# Patient Record
Sex: Male | Born: 1955 | Race: White | Hispanic: No | Marital: Single | Smoking: Never smoker
Health system: Southern US, Community
[De-identification: ages and names within clinical notes are randomized; demographics above are authoritative.]

## PROBLEM LIST (undated history)

## (undated) DIAGNOSIS — E079 Disorder of thyroid, unspecified: Secondary | ICD-10-CM

## (undated) DIAGNOSIS — N400 Enlarged prostate without lower urinary tract symptoms: Secondary | ICD-10-CM

## (undated) DIAGNOSIS — I10 Essential (primary) hypertension: Secondary | ICD-10-CM

## (undated) DIAGNOSIS — K760 Fatty (change of) liver, not elsewhere classified: Secondary | ICD-10-CM

## (undated) DIAGNOSIS — I4891 Unspecified atrial fibrillation: Secondary | ICD-10-CM

## (undated) DIAGNOSIS — F1010 Alcohol abuse, uncomplicated: Secondary | ICD-10-CM

## (undated) DIAGNOSIS — J45909 Unspecified asthma, uncomplicated: Secondary | ICD-10-CM

## (undated) HISTORY — DX: Fatty (change of) liver, not elsewhere classified: K76.0

## (undated) HISTORY — PX: TONSILLECTOMY: SUR1361

## (undated) HISTORY — DX: Alcohol abuse, uncomplicated: F10.10

## (undated) HISTORY — DX: Benign prostatic hyperplasia without lower urinary tract symptoms: N40.0

## (undated) HISTORY — PX: VARICOSE VEIN SURGERY: SHX832

## (undated) HISTORY — DX: Unspecified atrial fibrillation: I48.91

## (undated) HISTORY — PX: TOTAL HIP ARTHROPLASTY: SHX124

---

## 2020-07-04 ENCOUNTER — Emergency Department (HOSPITAL_COMMUNITY): Payer: 59

## 2020-07-04 ENCOUNTER — Emergency Department (HOSPITAL_COMMUNITY)
Admission: EM | Admit: 2020-07-04 | Discharge: 2020-07-05 | Disposition: A | Discharge status: Home / Self Care | Payer: 59 | Attending: Emergency Medicine | Admitting: Emergency Medicine

## 2020-07-04 ENCOUNTER — Other Ambulatory Visit: Payer: Self-pay

## 2020-07-04 ENCOUNTER — Encounter (HOSPITAL_COMMUNITY): Payer: Self-pay

## 2020-07-04 DIAGNOSIS — Y9289 Other specified places as the place of occurrence of the external cause: Secondary | ICD-10-CM | POA: Insufficient documentation

## 2020-07-04 DIAGNOSIS — S0990XA Unspecified injury of head, initial encounter: Secondary | ICD-10-CM | POA: Diagnosis present

## 2020-07-04 DIAGNOSIS — W108XXA Fall (on) (from) other stairs and steps, initial encounter: Secondary | ICD-10-CM | POA: Diagnosis not present

## 2020-07-04 DIAGNOSIS — Y9389 Activity, other specified: Secondary | ICD-10-CM | POA: Insufficient documentation

## 2020-07-04 DIAGNOSIS — Y999 Unspecified external cause status: Secondary | ICD-10-CM | POA: Diagnosis not present

## 2020-07-04 DIAGNOSIS — Z23 Encounter for immunization: Secondary | ICD-10-CM | POA: Insufficient documentation

## 2020-07-04 DIAGNOSIS — R7401 Elevation of levels of liver transaminase levels: Secondary | ICD-10-CM

## 2020-07-04 DIAGNOSIS — S0001XA Abrasion of scalp, initial encounter: Secondary | ICD-10-CM

## 2020-07-04 DIAGNOSIS — F10930 Alcohol use, unspecified with withdrawal, uncomplicated: Secondary | ICD-10-CM

## 2020-07-04 DIAGNOSIS — R17 Unspecified jaundice: Secondary | ICD-10-CM

## 2020-07-04 DIAGNOSIS — F17210 Nicotine dependence, cigarettes, uncomplicated: Secondary | ICD-10-CM | POA: Insufficient documentation

## 2020-07-04 DIAGNOSIS — E876 Hypokalemia: Secondary | ICD-10-CM

## 2020-07-04 NOTE — ED Notes (Signed)
Jeffrey Farrell fiance [phone] would like a call when pt is in a room

## 2020-07-04 NOTE — ED Triage Notes (Signed)
Pt BIB [facility], pt fell down stairs, unsure how many steps, lac to posterior head from fall, unsure what he hit his head on, pt intoxicated, pt reports a hx of ETOH abuse.  BP 168/96 HR 74 98% RA  CBG 118 97.3  RR 20

## 2020-07-05 ENCOUNTER — Emergency Department (HOSPITAL_COMMUNITY): Payer: 59

## 2020-07-05 LAB — CBC WITH DIFFERENTIAL/PLATELET
Abs Immature Granulocytes: 0.02 10*3/uL (ref 0.00–0.07)
Basophils Absolute: 0 10*3/uL (ref 0.0–0.1)
Basophils Relative: 1 %
Eosinophils Absolute: 0 10*3/uL (ref 0.0–0.5)
Eosinophils Relative: 0 %
HCT: 45.1 % (ref 39.0–52.0)
Hemoglobin: 15.6 g/dL (ref 13.0–17.0)
Immature Granulocytes: 0 %
Lymphocytes Relative: 7 %
Lymphs Abs: 0.5 10*3/uL — ABNORMAL LOW (ref 0.7–4.0)
MCH: 32.7 pg (ref 26.0–34.0)
MCHC: 34.6 g/dL (ref 30.0–36.0)
MCV: 94.5 fL (ref 80.0–100.0)
Monocytes Absolute: 0.7 10*3/uL (ref 0.1–1.0)
Monocytes Relative: 10 %
Neutro Abs: 6.1 10*3/uL (ref 1.7–7.7)
Neutrophils Relative %: 82 %
Platelets: 127 10*3/uL — ABNORMAL LOW (ref 150–400)
RBC: 4.77 MIL/uL (ref 4.22–5.81)
RDW: 12.4 % (ref 11.5–15.5)
WBC: 7.4 10*3/uL (ref 4.0–10.5)
nRBC: 0 % (ref 0.0–0.2)

## 2020-07-05 LAB — ETHANOL: Alcohol, Ethyl (B): 86 mg/dL — ABNORMAL HIGH (ref <10)

## 2020-07-05 LAB — URINALYSIS, ROUTINE W REFLEX MICROSCOPIC
Bacteria, UA: NONE SEEN
Bilirubin Urine: NEGATIVE
Glucose, UA: NEGATIVE mg/dL
Ketones, ur: 5 mg/dL — AB
Leukocytes,Ua: NEGATIVE
Nitrite: NEGATIVE
Protein, ur: 30 mg/dL — AB
Specific Gravity, Urine: 1.018 (ref 1.005–1.030)
pH: 6 (ref 5.0–8.0)

## 2020-07-05 LAB — COMPREHENSIVE METABOLIC PANEL
ALT: 130 U/L — ABNORMAL HIGH (ref 0–44)
AST: 166 U/L — ABNORMAL HIGH (ref 15–41)
Albumin: 4.5 g/dL (ref 3.5–5.0)
Alkaline Phosphatase: 68 U/L (ref 38–126)
Anion gap: 19 — ABNORMAL HIGH (ref 5–15)
BUN: 13 mg/dL (ref 8–23)
CO2: 20 mmol/L — ABNORMAL LOW (ref 22–32)
Calcium: 9.4 mg/dL (ref 8.9–10.3)
Chloride: 89 mmol/L — ABNORMAL LOW (ref 98–111)
Creatinine, Ser: 1.1 mg/dL (ref 0.61–1.24)
GFR calc Af Amer: >60 mL/min (ref >60)
GFR calc non Af Amer: >60 mL/min (ref >60)
Glucose, Bld: 136 mg/dL — ABNORMAL HIGH (ref 70–99)
Potassium: 3.3 mmol/L — ABNORMAL LOW (ref 3.5–5.1)
Sodium: 128 mmol/L — ABNORMAL LOW (ref 135–145)
Total Bilirubin: 1.5 mg/dL — ABNORMAL HIGH (ref 0.3–1.2)
Total Protein: 8.3 g/dL — ABNORMAL HIGH (ref 6.5–8.1)

## 2020-07-05 LAB — RAPID URINE DRUG SCREEN, HOSP PERFORMED
Amphetamines: NOT DETECTED
Barbiturates: NOT DETECTED
Benzodiazepines: NOT DETECTED
Cocaine: NOT DETECTED
Opiates: NOT DETECTED
Tetrahydrocannabinol: NOT DETECTED

## 2020-07-05 LAB — MAGNESIUM: Magnesium: 1.7 mg/dL (ref 1.7–2.4)

## 2020-07-05 MED ORDER — POTASSIUM CHLORIDE CRYS ER 20 MEQ PO TBCR
40.0000 meq | EXTENDED_RELEASE_TABLET | Freq: Once | ORAL | Status: AC
  Administered 2020-07-05: 40 meq via ORAL
  Filled 2020-07-05: qty 2

## 2020-07-05 MED ORDER — TETANUS-DIPHTH-ACELL PERTUSSIS 5-2.5-18.5 LF-MCG/0.5 IM SUSP
0.5000 mL | Freq: Once | INTRAMUSCULAR | Status: AC
  Administered 2020-07-05: 0.5 mL via INTRAMUSCULAR
  Filled 2020-07-05: qty 0.5

## 2020-07-05 MED ORDER — LORAZEPAM 2 MG/ML IJ SOLN
0.0000 mg | Freq: Four times a day (QID) | INTRAMUSCULAR | Status: DC
  Administered 2020-07-05: 2 mg via INTRAVENOUS
  Filled 2020-07-05: qty 2
  Filled 2020-07-05: qty 1

## 2020-07-05 MED ORDER — LORAZEPAM 2 MG/ML IJ SOLN
2.0000 mg | Freq: Once | INTRAMUSCULAR | Status: AC
  Administered 2020-07-05: 2 mg via INTRAVENOUS

## 2020-07-05 MED ORDER — THIAMINE HCL 100 MG/ML IJ SOLN
100.0000 mg | Freq: Every day | INTRAMUSCULAR | Status: DC
  Filled 2020-07-05: qty 2

## 2020-07-05 MED ORDER — THIAMINE HCL 100 MG PO TABS: 100.0000 mg | ORAL_TABLET | Freq: Every day | ORAL | Status: DC

## 2020-07-05 MED ORDER — LORAZEPAM 1 MG PO TABS: 0.0000 mg | ORAL_TABLET | Freq: Two times a day (BID) | ORAL | Status: DC

## 2020-07-05 MED ORDER — LORAZEPAM 2 MG/ML IJ SOLN: 0.0000 mg | Freq: Two times a day (BID) | INTRAMUSCULAR | Status: DC

## 2020-07-05 MED ORDER — LORAZEPAM 2 MG/ML IJ SOLN
2.0000 mg | Freq: Once | INTRAMUSCULAR | Status: AC
  Administered 2020-07-05: 2 mg via INTRAVENOUS
  Filled 2020-07-05: qty 1

## 2020-07-05 MED ORDER — LORAZEPAM 1 MG PO TABS: 0.0000 mg | ORAL_TABLET | Freq: Four times a day (QID) | ORAL | Status: DC

## 2020-07-05 MED ORDER — MAGNESIUM SULFATE 2 GM/50ML IV SOLN
2.0000 g | Freq: Once | INTRAVENOUS | Status: AC
  Administered 2020-07-05: 2 g via INTRAVENOUS
  Filled 2020-07-05: qty 50

## 2020-07-05 MED ORDER — CHLORDIAZEPOXIDE HCL 25 MG PO CAPS
ORAL_CAPSULE | ORAL | 0 refills | Status: DC
Start: 2020-07-05 — End: 2021-11-10

## 2020-07-05 MED ORDER — LACTATED RINGERS IV BOLUS
1000.0000 mL | Freq: Once | INTRAVENOUS | Status: AC
  Administered 2020-07-05: 1000 mL via INTRAVENOUS

## 2020-07-05 NOTE — Discharge Instructions (Addendum)
Please be sure to take medication as directed, and follow-up with your physician.  Return here for concerning changes in your condition.

## 2020-07-05 NOTE — Discharge Planning (Signed)
Transition of Care [facility]) - Emergency Department Mini Assessment   Patient Details  Name: Jeffrey Farrell MRN: [id number] Date of Birth: 03-06-1956  Transition of Care [facility]) CM/SW Contact:    Oletta Cohn, RN Phone Number: 07/05/2020, 11:16 AM   Clinical Narrative:  RNCM provided taxi voucher as pt has no transportation from hospital.  ED Mini Assessment: What brought you to the Emergency Department? : Fall  Barriers to Discharge: ED Barriers Resolved  Barrier interventions: transportation provided  Means of departure: Hospital Transport  Interventions which prevented an admission or readmission: Transportation Screening    Patient Contact and Communications        ,                 Admission diagnosis:  fall; head laceration  There are no problems to display for this patient.  PCP:  April Manson, NP Pharmacy:   [facility] [phone] Gin[place] Otto, [place] - [address] [place] [address] Phone: [phone] Fax: [phone]

## 2020-07-05 NOTE — ED Provider Notes (Signed)
8:58 AM Patient's care inherited from Dr. Preston Fleeting.  On this evaluation the patient sitting upright, speaking clearly, though he has mild tachycardia, he is in no distress, no evidence for decompensated withdrawal. I reviewed the patient's x-ray, discussed them with him.  No evidence for new fracture. Patient notes that his last alcohol was 1 day ago, is consistent with his abnormal though elevated alcohol reading of several hours ago. Patient has been receiving Ativan, per CIWA protocol, and will transition to oral meds on discharge.    Jeffrey Munch, MD 07/05/20 [phone]

## 2020-07-05 NOTE — ED Notes (Signed)
Case management in triage to help find pt ride home. Pt called his fiance but states unable to come get him at this time. Cab called.

## 2020-07-05 NOTE — ED Provider Notes (Addendum)
[facility] EMERGENCY DEPARTMENT Provider Note   CSN: [id number] Arrival date & time: 07/04/20  1619   History Chief Complaint  Patient presents with  . Fall    Jeffrey Farrell is a 64 y.o. male.  The history is provided by the patient.  Fall  He has history of hypertension, GERD, alcohol abuse and comes in after falling down carpeted steps.  He is not sure how many steps he actually fell down.  He suffered an injury to his scalp.  He denies loss of consciousness.  He does admit that he had been drinking prior to the fall.  He is now feeling very shaky and jittery since he had not had anything to drink since this morning.  He normally drinks about half a gallon of bourbon a day.  He does not know when his last tetanus immunization was.  He denies other injury.  He denies history of delirium tremens or alcohol withdrawal seizures.  He is requesting referral to go through detox.  History reviewed. No pertinent past medical history.  There are no problems to display for this patient.   ** The histories are not reviewed yet. Please review them in the "History" navigator section and refresh this SmartLink.     No family history on file.  Social History   Tobacco Use  . Smoking status: Current Every Day Smoker    Types: Cigarettes  . Smokeless tobacco: Never Used  Substance Use Topics  . Alcohol use: Yes  . Drug use: Yes    Home Medications Prior to Admission medications   Not on File    Allergies    Patient has no known allergies.  Review of Systems   Review of Systems  All other systems reviewed and are negative.   Physical Exam Updated Vital Signs BP (!) 143/90 (BP Location: Left Arm)   Pulse (!) 121   Temp 98.8 F (37.1 C) (Oral)   Resp (!) 24   SpO2 92%   Physical Exam Vitals and nursing note reviewed.   64 year old male, tremulous, but resting comfortably and in no acute distress. Vital signs are significant for elevated heart rate,  respiratory rate, blood pressure. Oxygen saturation is 92%, which is normal. Head is normocephalic.  Abrasion is noted at the vertex of the scalp without discrete laceration. PERRLA, EOMI. Oropharynx is clear. Neck is nontender and supple without adenopathy or JVD. Back is nontender and there is no CVA tenderness. Lungs are clear without rales, wheezes, or rhonchi. Chest is nontender. Heart has regular rate and rhythm without murmur. Abdomen is soft, flat, nontender without masses or hepatosplenomegaly and peristalsis is normoactive. Extremities have no cyanosis or edema, full range of motion is present. Skin is warm and dry without rash. Neurologic: Awake and alert, normal speech, cranial nerves are intact, there are no motor or sensory deficits.  He is very anxious and has moderate tremulousness.  ED Results / Procedures / Treatments   Labs (all labs ordered are listed, but only abnormal results are displayed) Labs Reviewed  COMPREHENSIVE METABOLIC PANEL - Abnormal; Notable for the following components:      Result Value   Sodium 128 (*)    Potassium 3.3 (*)    Chloride 89 (*)    CO2 20 (*)    Glucose, Bld 136 (*)    Total Protein 8.3 (*)    AST 166 (*)    ALT 130 (*)    Total Bilirubin 1.5 (*)  Anion gap 19 (*)    All other components within normal limits  CBC WITH DIFFERENTIAL/PLATELET - Abnormal; Notable for the following components:   Platelets 127 (*)    Lymphs Abs 0.5 (*)    All other components within normal limits  ETHANOL - Abnormal; Notable for the following components:   Alcohol, Ethyl (B) 86 (*)    All other components within normal limits  URINALYSIS, ROUTINE W REFLEX MICROSCOPIC - Abnormal; Notable for the following components:   Color, Urine AMBER (*)    APPearance HAZY (*)    Hgb urine dipstick SMALL (*)    Ketones, ur 5 (*)    Protein, ur 30 (*)    All other components within normal limits  RAPID URINE DRUG SCREEN, HOSP PERFORMED  MAGNESIUM    Radiology CT Head Wo Contrast  Result Date: 07/04/2020 CLINICAL DATA:  Fall down stairs. EXAM: CT HEAD WITHOUT CONTRAST TECHNIQUE: Contiguous axial images were obtained from the base of the skull through the vertex without intravenous contrast. COMPARISON:  None. FINDINGS: Brain: No acute intracranial abnormality. Specifically, no hemorrhage, hydrocephalus, mass lesion, acute infarction, or significant intracranial injury. Vascular: No hyperdense vessel or unexpected calcification. Skull: No acute calvarial abnormality. Sinuses/Orbits: Visualized paranasal sinuses and mastoids clear. Orbital soft tissues unremarkable. Other: None IMPRESSION: No acute intracranial abnormality. Electronically Signed   By: Charlett Nose M.D.   On: 07/04/2020 17:24    Procedures Procedures  CRITICAL CARE Performed by: Dione Booze Total critical care time: 35 minutes Critical care time was exclusive of separately billable procedures and treating other patients. Critical care was necessary to treat or prevent imminent or life-threatening deterioration. Critical care was time spent personally by me on the following activities: development of treatment plan with patient and/or surrogate as well as nursing, discussions with consultants, evaluation of patient's response to treatment, examination of patient, obtaining history from patient or surrogate, ordering and performing treatments and interventions, ordering and review of laboratory studies, ordering and review of radiographic studies, pulse oximetry and re-evaluation of patient's condition.  Medications Ordered in ED Medications  LORazepam (ATIVAN) injection 0-4 mg (has no administration in time range)    Or  LORazepam (ATIVAN) tablet 0-4 mg (has no administration in time range)  LORazepam (ATIVAN) injection 0-4 mg (has no administration in time range)    Or  LORazepam (ATIVAN) tablet 0-4 mg (has no administration in time range)  thiamine tablet 100 mg (has no  administration in time range)    Or  thiamine (B-1) injection 100 mg (has no administration in time range)  lactated ringers bolus 1,000 mL (has no administration in time range)  Tdap (BOOSTRIX) injection 0.5 mL (has no administration in time range)    ED Course  I have reviewed the triage vital signs and the nursing notes.  Pertinent labs & imaging results that were available during my care of the patient were reviewed by me and considered in my medical decision making (see chart for details).  MDM Rules/Calculators/A&P Fall with head injury.  He was sent for CT of head which showed no acute intracranial process.  Alcohol withdrawal.  Screening labs are obtained showing moderate elevation of transaminases with slight elevation of bilirubin, consistent with history of alcohol abuse.  Mild hyponatremia and hypokalemia noted as well as mild thrombocytopenia.  He will be given IV fluids, oral potassium.  We will also check magnesium level.  He is started on CIWA protocol.  Old records are reviewed, and I can find  no record of tetanus immunization.  Tdap booster is given.  5:07 AM Magnesium is borderline low, he is given IV magnesium.  He is resting comfortably and is no longer tremulous.  6:56 AM He continues to be only minimally tremulous.  However, he is now complaining of pain in the left hip and pelvis area and states that he was unable to bear weight at home.  On reexam, he is noted to be tender over the left pelvic brim.  Will send for x-rays.  7:41 AM X-rays are still pending.  Case is signed out to Dr. Jeraldine Loots.  Final Clinical Impression(s) / ED Diagnoses Final diagnoses:  Fall down steps, initial encounter  Abrasion of scalp, initial encounter  Alcohol withdrawal syndrome without complication (HCC)  Hypokalemia  Elevated transaminase level  Serum total bilirubin elevated    Rx / DC Orders ED Discharge Orders    None       Dione Booze, MD 07/05/20 0255    Dione Booze, MD 07/05/20 [phone]

## 2020-07-05 NOTE — ED Notes (Signed)
Triage RN notified of patient blood pressure and heart rate and tremors

## 2021-11-08 ENCOUNTER — Encounter (HOSPITAL_COMMUNITY): Payer: Self-pay | Admitting: Emergency Medicine

## 2021-11-08 ENCOUNTER — Observation Stay (HOSPITAL_COMMUNITY): Payer: BC Managed Care – PPO

## 2021-11-08 ENCOUNTER — Other Ambulatory Visit: Payer: Self-pay

## 2021-11-08 ENCOUNTER — Emergency Department (HOSPITAL_COMMUNITY): Payer: BC Managed Care – PPO

## 2021-11-08 ENCOUNTER — Observation Stay (HOSPITAL_COMMUNITY)
Admission: EM | Admit: 2021-11-08 | Discharge: 2021-11-10 | Disposition: A | Discharge status: Home / Self Care | Payer: BC Managed Care – PPO | Attending: Family Medicine | Admitting: Family Medicine

## 2021-11-08 DIAGNOSIS — Z87891 Personal history of nicotine dependence: Secondary | ICD-10-CM | POA: Insufficient documentation

## 2021-11-08 DIAGNOSIS — K219 Gastro-esophageal reflux disease without esophagitis: Secondary | ICD-10-CM

## 2021-11-08 DIAGNOSIS — E039 Hypothyroidism, unspecified: Secondary | ICD-10-CM | POA: Diagnosis not present

## 2021-11-08 DIAGNOSIS — F10988 Alcohol use, unspecified with other alcohol-induced disorder: Secondary | ICD-10-CM | POA: Diagnosis not present

## 2021-11-08 DIAGNOSIS — R1010 Upper abdominal pain, unspecified: Secondary | ICD-10-CM | POA: Diagnosis not present

## 2021-11-08 DIAGNOSIS — Z79899 Other long term (current) drug therapy: Secondary | ICD-10-CM | POA: Insufficient documentation

## 2021-11-08 DIAGNOSIS — I4891 Unspecified atrial fibrillation: Secondary | ICD-10-CM | POA: Diagnosis not present

## 2021-11-08 DIAGNOSIS — J45909 Unspecified asthma, uncomplicated: Secondary | ICD-10-CM | POA: Diagnosis not present

## 2021-11-08 DIAGNOSIS — Z20822 Contact with and (suspected) exposure to covid-19: Secondary | ICD-10-CM | POA: Insufficient documentation

## 2021-11-08 DIAGNOSIS — R1084 Generalized abdominal pain: Secondary | ICD-10-CM | POA: Diagnosis not present

## 2021-11-08 DIAGNOSIS — R933 Abnormal findings on diagnostic imaging of other parts of digestive tract: Secondary | ICD-10-CM

## 2021-11-08 DIAGNOSIS — R109 Unspecified abdominal pain: Secondary | ICD-10-CM | POA: Diagnosis present

## 2021-11-08 DIAGNOSIS — Y900 Blood alcohol level of less than 20 mg/100 ml: Secondary | ICD-10-CM | POA: Diagnosis not present

## 2021-11-08 DIAGNOSIS — I10 Essential (primary) hypertension: Secondary | ICD-10-CM | POA: Insufficient documentation

## 2021-11-08 DIAGNOSIS — R1310 Dysphagia, unspecified: Secondary | ICD-10-CM

## 2021-11-08 HISTORY — DX: Essential (primary) hypertension: I10

## 2021-11-08 HISTORY — DX: Unspecified asthma, uncomplicated: J45.909

## 2021-11-08 HISTORY — DX: Disorder of thyroid, unspecified: E07.9

## 2021-11-08 LAB — COMPREHENSIVE METABOLIC PANEL
ALT: 43 U/L (ref 0–44)
AST: 58 U/L — ABNORMAL HIGH (ref 15–41)
Albumin: 3.8 g/dL (ref 3.5–5.0)
Alkaline Phosphatase: 55 U/L (ref 38–126)
Anion gap: 16 — ABNORMAL HIGH (ref 5–15)
BUN: 10 mg/dL (ref 8–23)
CO2: 20 mmol/L — ABNORMAL LOW (ref 22–32)
Calcium: 8.9 mg/dL (ref 8.9–10.3)
Chloride: 95 mmol/L — ABNORMAL LOW (ref 98–111)
Creatinine, Ser: 1.05 mg/dL (ref 0.61–1.24)
GFR, Estimated: >60 mL/min (ref >60)
Glucose, Bld: 82 mg/dL (ref 70–99)
Potassium: 2.9 mmol/L — ABNORMAL LOW (ref 3.5–5.1)
Sodium: 131 mmol/L — ABNORMAL LOW (ref 135–145)
Total Bilirubin: 1.6 mg/dL — ABNORMAL HIGH (ref 0.3–1.2)
Total Protein: 7.1 g/dL (ref 6.5–8.1)

## 2021-11-08 LAB — CBC WITH DIFFERENTIAL/PLATELET
Abs Immature Granulocytes: 0.03 10*3/uL (ref 0.00–0.07)
Basophils Absolute: 0.1 10*3/uL (ref 0.0–0.1)
Basophils Relative: 1 %
Eosinophils Absolute: 0 10*3/uL (ref 0.0–0.5)
Eosinophils Relative: 0 %
HCT: 44.1 % (ref 39.0–52.0)
Hemoglobin: 15.4 g/dL (ref 13.0–17.0)
Immature Granulocytes: 0 %
Lymphocytes Relative: 11 %
Lymphs Abs: 0.8 10*3/uL (ref 0.7–4.0)
MCH: 32.7 pg (ref 26.0–34.0)
MCHC: 34.9 g/dL (ref 30.0–36.0)
MCV: 93.6 fL (ref 80.0–100.0)
Monocytes Absolute: 0.7 10*3/uL (ref 0.1–1.0)
Monocytes Relative: 9 %
Neutro Abs: 6.1 10*3/uL (ref 1.7–7.7)
Neutrophils Relative %: 79 %
Platelets: 265 10*3/uL (ref 150–400)
RBC: 4.71 MIL/uL (ref 4.22–5.81)
RDW: 12.4 % (ref 11.5–15.5)
WBC: 7.7 10*3/uL (ref 4.0–10.5)
nRBC: 0 % (ref 0.0–0.2)

## 2021-11-08 LAB — URINALYSIS, ROUTINE W REFLEX MICROSCOPIC
Glucose, UA: NEGATIVE mg/dL
Hgb urine dipstick: NEGATIVE
Ketones, ur: >80 mg/dL — AB
Leukocytes,Ua: NEGATIVE
Nitrite: NEGATIVE
Protein, ur: 30 mg/dL — AB
Specific Gravity, Urine: >1.03 — ABNORMAL HIGH (ref 1.005–1.030)
pH: 6 (ref 5.0–8.0)

## 2021-11-08 LAB — TSH: TSH: 5.297 u[IU]/mL — ABNORMAL HIGH (ref 0.350–4.500)

## 2021-11-08 LAB — URINALYSIS, MICROSCOPIC (REFLEX)

## 2021-11-08 LAB — RAPID URINE DRUG SCREEN, HOSP PERFORMED
Amphetamines: NOT DETECTED
Barbiturates: NOT DETECTED
Benzodiazepines: POSITIVE — AB
Cocaine: NOT DETECTED
Opiates: NOT DETECTED
Tetrahydrocannabinol: NOT DETECTED

## 2021-11-08 LAB — ETHANOL: Alcohol, Ethyl (B): <10 mg/dL (ref <10)

## 2021-11-08 LAB — PROTIME-INR
INR: 1 (ref 0.8–1.2)
Prothrombin Time: 13.2 seconds (ref 11.4–15.2)

## 2021-11-08 LAB — TROPONIN I (HIGH SENSITIVITY)
Troponin I (High Sensitivity): 12 ng/L (ref <18)
Troponin I (High Sensitivity): 14 ng/L (ref <18)

## 2021-11-08 LAB — MAGNESIUM: Magnesium: 1.7 mg/dL (ref 1.7–2.4)

## 2021-11-08 LAB — LIPASE, BLOOD: Lipase: 51 U/L (ref 11–51)

## 2021-11-08 LAB — HIV ANTIBODY (ROUTINE TESTING W REFLEX): HIV Screen 4th Generation wRfx: NONREACTIVE

## 2021-11-08 LAB — RESP PANEL BY RT-PCR (FLU A&B, COVID) ARPGX2
Influenza A by PCR: NEGATIVE
Influenza B by PCR: NEGATIVE
SARS Coronavirus 2 by RT PCR: NEGATIVE

## 2021-11-08 MED ORDER — HEPARIN BOLUS VIA INFUSION
4000.0000 [IU] | Freq: Once | INTRAVENOUS | Status: AC
  Administered 2021-11-08: 22:00:00 4000 [IU] via INTRAVENOUS
  Filled 2021-11-08: qty 4000

## 2021-11-08 MED ORDER — PANTOPRAZOLE SODIUM 40 MG IV SOLR
40.0000 mg | Freq: Two times a day (BID) | INTRAVENOUS | Status: DC
  Administered 2021-11-08 – 2021-11-09 (×3): 40 mg via INTRAVENOUS
  Filled 2021-11-08 (×3): qty 40

## 2021-11-08 MED ORDER — ONDANSETRON 4 MG PO TBDP
4.0000 mg | ORAL_TABLET | Freq: Once | ORAL | Status: AC
  Administered 2021-11-08: 15:00:00 4 mg via ORAL
  Filled 2021-11-08: qty 1

## 2021-11-08 MED ORDER — TAMSULOSIN HCL 0.4 MG PO CAPS
0.4000 mg | ORAL_CAPSULE | Freq: Every day | ORAL | Status: DC
  Administered 2021-11-08 – 2021-11-10 (×3): 0.4 mg via ORAL
  Filled 2021-11-08 (×3): qty 1

## 2021-11-08 MED ORDER — LORAZEPAM 1 MG PO TABS
1.0000 mg | ORAL_TABLET | ORAL | Status: DC | PRN
  Administered 2021-11-09: 1 mg via ORAL
  Filled 2021-11-08: qty 1

## 2021-11-08 MED ORDER — SODIUM CHLORIDE 0.9 % IV SOLN: Freq: Once | INTRAVENOUS | Status: AC

## 2021-11-08 MED ORDER — HEPARIN (PORCINE) 25000 UT/250ML-% IV SOLN
1700.0000 [IU]/h | INTRAVENOUS | Status: AC
  Administered 2021-11-08: 22:00:00 1500 [IU]/h via INTRAVENOUS
  Administered 2021-11-09 – 2021-11-10 (×2): 1700 [IU]/h via INTRAVENOUS
  Filled 2021-11-08 (×3): qty 250

## 2021-11-08 MED ORDER — THIAMINE HCL 100 MG PO TABS
100.0000 mg | ORAL_TABLET | Freq: Every day | ORAL | Status: DC
  Administered 2021-11-08 – 2021-11-10 (×3): 100 mg via ORAL
  Filled 2021-11-08 (×3): qty 1

## 2021-11-08 MED ORDER — THIAMINE HCL 100 MG/ML IJ SOLN: 100.0000 mg | Freq: Every day | INTRAMUSCULAR | Status: DC

## 2021-11-08 MED ORDER — LEVOTHYROXINE SODIUM 75 MCG PO TABS
150.0000 ug | ORAL_TABLET | Freq: Every day | ORAL | Status: DC
  Administered 2021-11-09 – 2021-11-10 (×2): 150 ug via ORAL
  Filled 2021-11-08 (×2): qty 2

## 2021-11-08 MED ORDER — DILTIAZEM HCL-DEXTROSE 125-5 MG/125ML-% IV SOLN (PREMIX)
5.0000 mg/h | INTRAVENOUS | Status: DC
  Administered 2021-11-08: 12:00:00 10 mg/h via INTRAVENOUS
  Administered 2021-11-09: 15 mg/h via INTRAVENOUS
  Filled 2021-11-08 (×3): qty 125

## 2021-11-08 MED ORDER — POTASSIUM CHLORIDE 10 MEQ/100ML IV SOLN
10.0000 meq | Freq: Once | INTRAVENOUS | Status: AC
  Administered 2021-11-08: 16:00:00 10 meq via INTRAVENOUS
  Filled 2021-11-08: qty 100

## 2021-11-08 MED ORDER — MELATONIN 3 MG PO TABS: 3.0000 mg | ORAL_TABLET | Freq: Once | ORAL | Status: DC

## 2021-11-08 MED ORDER — FOLIC ACID 1 MG PO TABS
1.0000 mg | ORAL_TABLET | Freq: Every day | ORAL | Status: DC
  Administered 2021-11-08 – 2021-11-10 (×3): 1 mg via ORAL
  Filled 2021-11-08 (×3): qty 1

## 2021-11-08 MED ORDER — LORAZEPAM 2 MG/ML IJ SOLN
1.0000 mg | INTRAMUSCULAR | Status: DC | PRN
  Administered 2021-11-08: 20:00:00 1 mg via INTRAVENOUS
  Administered 2021-11-08: 16:00:00 2 mg via INTRAVENOUS
  Administered 2021-11-08: 23:00:00 1 mg via INTRAVENOUS
  Administered 2021-11-09 – 2021-11-10 (×4): 2 mg via INTRAVENOUS
  Filled 2021-11-08 (×6): qty 1

## 2021-11-08 MED ORDER — ADULT MULTIVITAMIN W/MINERALS CH
1.0000 | ORAL_TABLET | Freq: Every day | ORAL | Status: DC
  Administered 2021-11-08 – 2021-11-10 (×3): 1 via ORAL
  Filled 2021-11-08 (×3): qty 1

## 2021-11-08 MED ORDER — POTASSIUM CHLORIDE 10 MEQ/100ML IV SOLN
10.0000 meq | INTRAVENOUS | Status: AC
  Administered 2021-11-08 (×4): 10 meq via INTRAVENOUS
  Filled 2021-11-08 (×4): qty 100

## 2021-11-08 MED ORDER — ENOXAPARIN SODIUM 40 MG/0.4ML IJ SOSY: 40.0000 mg | PREFILLED_SYRINGE | INTRAMUSCULAR | Status: DC

## 2021-11-08 NOTE — ED Notes (Signed)
Pt sitting in stretcher, a little restless.  Cardizem gtt infusing and last bag of potassium hung. Discussed POC. Still awaiting bed assignment and Heparin gtt to be started.  Pt also wanting to eat or at least drink. Provider paged

## 2021-11-08 NOTE — Progress Notes (Signed)
ANTICOAGULATION CONSULT NOTE - Initial Consult  Pharmacy Consult for heparin Indication: atrial fibrillation  No Known Allergies  Patient Measurements: Height: 6\' 1"  (185.4 cm) Weight: 99.8 kg (220 lb) IBW/kg (Calculated) : 79.9 Heparin Dosing Weight: 99.8 kg   Vital Signs: Temp: 98.2 F (36.8 C) (12/14 1200) Temp Source: Oral (12/14 1200) BP: 139/80 (12/14 1800) Pulse Rate: 103 (12/14 1800)  Labs: Recent Labs    11/08/21 1212 11/08/21 1630  HGB 15.4  --   HCT 44.1  --   PLT 265  --   LABPROT 13.2  --   INR 1.0  --   CREATININE 1.05  --   TROPONINIHS 12 14    Estimated Creatinine Clearance: 88.4 mL/min (by C-G formula based on SCr of 1.05 mg/dL).   Medical History: Past Medical History:  Diagnosis Date   Asthma    Hypertension    Thyroid disease     Medications:  (Not in a hospital admission)   Assessment: 67 YOM with new onset Afib RVR. Pharmacy consulted to start IV heparin for primary stroke prophylaxis. H/H and Plt wnl. SCr wnl  Goal of Therapy:  Heparin level 0.3-0.7 units/ml Monitor platelets by anticoagulation protocol: Yes   Plan:  -D/c SQ lovenox order (no doses given) -Heparin 4000 units IV bolus followed by heparin infusion at 1500 units/hr -F/u 6 hr HL -Monitor daily HL, CBC and s/s of bleeding   78, PharmD., BCPS, BCCCP Clinical Pharmacist Please refer to [facility] for unit-specific pharmacist

## 2021-11-08 NOTE — H&P (Signed)
Family Medicine Teaching [facility] Admission History and Physical Service Pager: [phone]  Patient name: Jeffrey Farrell Medical record number: [id number] Date of birth: [birth year] Age: 65 y.o. Gender: male  Primary Care Provider: April Manson, NP Consultants: None Code Status: Full  - however, pt does not want to be dependent on machine Preferred Emergency Contact:  Contact Information     Name Relation Home Work Mobile   wafford,kimberly Significant other   [phone]   [place]   [phone]      Chief Complaint: Palpitations and shortness of breath  Assessment and Plan: Jeffrey Farrell is a 65 y.o. male presenting with shortness of breath and chest pain and found to be in Afib w/ RVR. PMH is significant for hypothyroidism, HTN, GERD, GAD, BPH, insomnia, alcohol abuse disorder and medication nonadherence.  New onset Afib w/ RVR   Vomiting w/ abdominal discomfort   hx of GERD Patient presented with new onset A. fib in the rates of 180-200s via EMS.  Received adenosine 05/08/2011 and diltiazem 10 mg.  Placed on diltiazem drip in the ED for rhythm control.  The day before, patient had significant bourbon intake and Viagra use.  Denies previously experiencing this.  New onset A. fib with causes likely relating to alcohol use, hypokalemia, thyroid dysfunction.  CXR unremarkable.  EKG notable for A. fib.  CBC, alcohol level, and troponins unremarkable.  CMP remarkable for hyponatremia 131 and hypokalemia at 2.9.  During encounter, patient began having vomiting without declaring nausea.  He did not present with abdominal or epigastric tenderness but in the setting of alcohol use, concern for abdominal etiology, particularly pancreatitis.  Patient would benefit from echo to evaluate for thrombus formation in the setting of AFib.  He has better rate controlled in the 120s at this time. Additionally would benefit from abdominal imaging for new onset vomiting and abdominal  discomfort in the setting of alcohol use. -Admitted to [facility]PT[facility], attending Dr. Jennette Kettle, med telemetry unit -Cardiac monitoring continuous indefinite -Vital signs every 6 hours x48 hours, then per unit with pulse ox check -CT abdomen/pelvis without contrast -Echocardiogram -PT/OT eval and treat -UDS, UA -Trend troponin -Lipase -Continue diltiazem 5 to 15 mg/h IV continuous -Zofran ODT -Protonix 40 mg IV every 12 hours  Alcohol use disorder Patient notes he stopped drinking for a while but yesterday drank about half a gallon of bourbon with his last drink being approximately 9:30 AM today.  -CIWA protocol with corresponding treatment -Consult to [facility] for substance abuse counseling and education -Thiamine 100 mg oral daily -MVI 1 tablet daily -Folic acid 1 mg daily  Hypokalemia Patient hypokalemic at 2.9.  Received 10 mEq potassium from ED.  Likely in the setting of nutritional deficiencies 2/2 alcohol use. -Potassium chloride 10 mEq IV every hour x5 -Magnesium level -Monitor BMP  Hypothyroidism Home medications include Synthroid 150 mcg daily.  Notably, patient is poorly adherent to medications. -Hold home medications -TSH  HTN Home medications include amlodipine 5 mg daily and HCTZ 25 mg daily.  Blood pressures of ranged from the 100s to 140s over 70s to 100s. -Monitor blood pressures -Hold home medications  Insomnia   GAD Patient reportedly has history of insomnia and anxiety.  He has Klonopin, temazepam, and Librium listed for home medications.  Patient states he took Librium to assist with alcohol cessation in the past.  He actively takes temazepam for insomnia but medications on his person do not match PDMP and patient has history of medication nonadherence.  Medication in the room appeared to be a full prescription of temazepam 30 mg. -CIWA protocol with corresponding treatment in place  BPH Home medications include Flomax 0.4 mg daily. -Continue home medications  FEN/GI:  Heart healthy Prophylaxis: Lovenox  Disposition: Med telemetry  History of Present Illness:  Jeffrey Farrell is a 65 y.o. male presenting with shortness of breath and palpitations.  Patient notes that yesterday he drank about a half a gallon of bourbon that was mixed with Diet Coke.  He was having issues sleeping last night and felt a high heart rate.  This morning at 9:30 AM he began to "feel funny" and felt his heart rate to be fast with an irregular rhythm.  Denies feeling this in the past.  This is also around the time of his last drink (bourbon).  He has had withdrawals from alcohol in the past but never requiring hospitalization or experiencing delirium or seizures.  Endorses occasional marijuana use.  Patient also endorses pain in his right arm that comes and goes which has been going on for the past few days.  Patient also endorses stomach pain that "feels like indigestion".  Denies any nausea or vomiting recently but during encounter experienced new onset vomiting.  He could not state whether this felt like his normal indigestion.  He states that he takes Protonix "when I need it".  He does not always take his medications daily.  He reports that he used to drink alcohol more frequently but stopped with the help of Librium and had not drank for the last couple months until yesterday.  Review Of Systems: Per HPI with the following additions:  Review of Systems  Constitutional:  Negative for fever.  HENT:  Negative for congestion.   Eyes:  Negative for visual disturbance.  Respiratory:  Positive for cough and shortness of breath.   Cardiovascular:  Positive for palpitations. Negative for chest pain.  Gastrointestinal:  Positive for abdominal pain, nausea and vomiting. Negative for constipation and diarrhea.    Past Medical History: Past Medical History:  Diagnosis Date   Asthma    Hypertension    Thyroid disease     Past Surgical History: Past Surgical History:  Procedure  Laterality Date   TONSILLECTOMY      Social History: Social History   Tobacco Use   Smoking status: Former    Types: Cigarettes   Smokeless tobacco: Never  Substance Use Topics   Alcohol use: Yes   Drug use: Yes   Additional social history: Last drink approximately 9:30 AM.  Denies drug use other than marijuana  Family History: No family history on file.  Allergies and Medications: No Known Allergies No current facility-administered medications on file prior to encounter.   Current Outpatient Medications on File Prior to Encounter  Medication Sig Dispense Refill   chlordiazePOXIDE (LIBRIUM) 25 MG capsule 50mg  PO TID x 1D, then 25-50mg  PO BID X 1D, then 25-50mg  PO QD X 1D 10 capsule 0    Objective: BP 135/71    Pulse 86    Temp 98.2 F (36.8 C) (Oral)    Resp 14    Ht 6\' 1"  (1.854 m)    Wt 99.8 kg    SpO2 98%    BMI 29.03 kg/m  Exam: General: A&O x3, NAD, tired appearing Eyes: Conjunctival injection, EOMI Cardiovascular: Irregularly irregular, tachycardic, no murmurs auscultated Respiratory: CTA B, no increased work of breathing Gastrointestinal: Obese abdomen, soft, distended, normoactive bowel sounds MSK: Moves all extremities appropriately  Derm: No rashes or lesions appreciated Neuro: No focal deficits, coherent speech and capable in conversation Psych: Normal mood and behavior  Labs and Imaging: CBC BMET  Recent Labs  Lab 11/08/21 1212  WBC 7.7  HGB 15.4  HCT 44.1  PLT 265   Recent Labs  Lab 11/08/21 1212  NA 131*  K 2.9*  CL 95*  CO2 20*  BUN 10  CREATININE 1.05  GLUCOSE 82  CALCIUM 8.9     EKG: Irregular rhythm without P waves, tachycardic, normal QRS interval, no ST elevations consistent with atrial fibrillation with RVR  Alcohol Level    Component Value Date/Time   [facility] <10 11/08/2021 1219    Troponins: 12  Wells Guiles, DO 11/08/2021, 1:47 PM PGY-1, [facility] Intern pager: [phone], text pages welcome

## 2021-11-08 NOTE — Progress Notes (Signed)
Call Pager [phone] for any questions or notifications regarding this patient  FMTS Attending Admission Note: Jeffrey Levy MD Attending pager:[phone] I  have seen and examined this patient, reviewed their chart. I have discussed this patient with the resident.  Residents complete history and physical is in progress.  Briefly:  65 year old male presented to the emergency department with rapid heart rate.  He had received adenosine via EMS but his rate was still in 115 range on admission to the ED.  Cardizem drip was started.  EXAM: General: Well-developed male no acute distress HEENT: Mild injection of the sclera but no scleral icterus.  Pupils equal round reactive to light.  Extraocular muscles are intact.  Neck is without any lymphadenopathy.  No JVD.  No thyromegaly is noted. CARDIOVASCULAR: Irregularly irregular, tachycardic. LUNGS: Clear to auscultation bilaterally without any rales or wheezes ABDOMEN: Soft, obese.  Nontender to deep palpation.  No rebound or guarding.  No masses are noted although this exam is limited somewhat by his habitus. EXTREMITY: Normal muscle bulk and tone.  Movement of extremity x4.  No edema is noted in lower extremities. SKIN: Normal temperature.  No obvious rash although complete skin exam was not performed.  No jaundice is noted. PSYCH: Alert and oriented x4.  Normally interactive.  Speech was normal in fluency and content.  Denies hallucinations.  No agitation, no somnolence. NEURO: Cranial nerves II through XII are grossly intact.  Very slight fine tremor in hands when held in extension.  Gait analysis not done.  EKG: Rate 132.  Left axis deviation.  Atrial fibrillation with rapid ventricular response.  Occasional premature complex.  No ST depression or elevation is noted. LABS: Notable for sodium 131, potassium 2.9.  Total bilirubin 1.6, AST 58.  Hemoglobin 15.4.   Pending laboratory results include: Urine drug screen, TSH, ethanol, lipase.    Assessment/plan #1.  Atrial fibrillation with rapid ventricular response, apparently new diagnosis.  Has been started on Cardizem drip and we will titrate. 2.  Substance use disorder: Reports long history of alcoholism with remission in the last year until several days ago when he started drinking bourbon.  Reports approximately 1 gallon of bourbon in the last 3 days.  CIWA protocol.  We will follow closely for signs of withdrawal.  Await urine drug screen as well. -Additionally, he reports taking 120 mg of Viagra in the last 24 hours even though he knows that is not the  dosing prescribed for him.  He has no complaints of penile pain or persistent erection. 3.  Abdominal pain: He describes the pain as feeling like something is stuck in there, relating it similar to when you get something struck in your throat.  Location of the pain however is in mid epigastric.  It is not tender to palpation there is no mass there.  I cannot reproduce the pain.  He seems somewhat uncomfortable on my exam, having to reposition the bed several times.  We will get stat CT abdomen pelvis with oral contrast.  Differential includes but is not limited to: Pancreatitis, gastric ulcer, abdominal aortic aneurysm, hiatal hernia, gastritis.  N.p.o. for now.  Once we see the CT scan results, we may be able to resume diet.  Will need inpatient admission for further work-up and monitoring of these issues.

## 2021-11-08 NOTE — ED Notes (Signed)
ED Provider at bedside. 

## 2021-11-08 NOTE — Progress Notes (Signed)
FPTS Brief Progress Note  S: Patient seen at bedside for evening rounds. Complains of "indigestion" in his mid and upper abdomen, but is asking to eat/drink something. Denies chest pain, palpitations, SOB, dizziness, or any further vomiting.  O: BP (!) 147/82    Pulse (!) 102    Temp 98.2 F (36.8 C) (Oral)    Resp 15    Ht 6\' 1"  (1.854 m)    Wt 99.8 kg    SpO2 94%    BMI 29.03 kg/m   Gen: alert, no acute distress CV: irregularly irregular rhythm, rate 100-110s Resp: normal work of breathing GI: abdomen soft, nontender, nondistended Neuro: A&O x3, normal speech, grossly intact, no tremulousness  A/P: Esophagitis Noted on CT abd/pelvis. Likely related to alcohol use. -Protonix 40mg  IV BID -Clear liquid diet -Consider GI consult and possible EGD  New Onset A-Fib w/RVR Currently in a-fib with HR in the low 100-110s. Patient asymptomatic at the present time. Presumed 2/2 excess alcohol and viagra use. -Continue cardizem gtt -Will discuss with cardiology tomorrow am  Alcohol Use Disorder No evidence of withdrawal at this time. Last drink 12/14 at 9:30am. -CIWA monitoring w/PRN Ativan  Hypokalemia K 2.9 on admission. Repleted w/65mEq IV K. -am BMP  Remainder per H&P.  - Orders reviewed. Labs for AM ordered, which was adjusted as needed.    1/15, MD 11/08/2021, 9:23 PM PGY-2, [facility] Night Resident  Please page [phone] with questions.

## 2021-11-08 NOTE — ED Notes (Signed)
Pt started on CL diet. Broth and water given

## 2021-11-08 NOTE — ED Triage Notes (Signed)
Patient presents to the ED by EMS with c/o Chest pain since last night along with SOB. Using bourbon and Viagra. Thinks he took 1 too many pills. HR initially in 200's-180 received 05/08/11 adenosine showing a.fib underlying rhythm. 10 mg x2 & 5 mg/hr Cardizem gtt. When turned off HR goes back up to 150.  Pt has been off all antihypertensive and rate control meds.

## 2021-11-08 NOTE — Progress Notes (Signed)
11/08/21 1850  CHADS2-VASc  CHF History 0  HTN History 1  Diabetes History 0  Stroke History 0  Vascular Disease History 0  Calculated Age 65 16.69  Calculated Age 33 36  Calculated CHADS Age 80  Gender Calculation 2  Age Score 0  Gender Score 0  CHADS2-VASc Score 1  Annual Stroke Risk % 0.6   Discussed anticoagulation risk with attending in the setting of new onset A. fib.  CHADS2Vasc calculated above.  This places patient in low-moderate risk with consideration of antiplatelet or anticoagulation.  Decision to start heparin per pharmacy and proceed with echo as planned.

## 2021-11-08 NOTE — ED Provider Notes (Signed)
[facility] Provider Note   CSN: [id number] Arrival date & time: 11/08/21  1145     History Chief Complaint  Patient presents with   Atrial Fibrillation   Chest Pain    Jeffrey Farrell is a 65 y.o. male.  65 year old male with prior medical history as detailed below presents for evaluation.  Patient reports that for the last several days he has been "drinking lots of bourbon and taking lots of Viagra."  This morning he developed a very fast heart rate.  He called EMS.  EMS reports initial heart rate was in 190s - 200s.  Patient received adenosine 05/08/11 with suspected underlying atrial fibrillation.  10 mg of Cardizem administered x2 and then initiation of Cardizem drip.  With Cardizem drip patient with heart rate into the 120s.  Patient reports that he is not previously diagnosed with atrial fibrillation.  Patient reports that he is not taking his regular medications.  The history is provided by the patient.  Atrial Fibrillation This is a new problem. The current episode started 3 to 5 hours ago. The problem occurs rarely. The problem has not changed since onset.Associated symptoms include chest pain. Nothing aggravates the symptoms. Nothing relieves the symptoms.  Chest Pain     Past Medical History:  Diagnosis Date   Asthma    Hypertension    Thyroid disease     There are no problems to display for this patient.   Past Surgical History:  Procedure Laterality Date   TONSILLECTOMY         No family history on file.  Social History   Tobacco Use   Smoking status: Former    Types: Cigarettes   Smokeless tobacco: Never  Substance Use Topics   Alcohol use: Yes   Drug use: Yes    Home Medications Prior to Admission medications   Medication Sig Start Date End Date Taking? Authorizing Provider  chlordiazePOXIDE (LIBRIUM) 25 MG capsule 50mg  PO TID x 1D, then 25-50mg  PO BID X 1D, then 25-50mg  PO QD X 1D 07/05/20    Farrell Muskrat, MD    Allergies    Patient has no known allergies.  Review of Systems   Review of Systems  Cardiovascular:  Positive for chest pain.  All other systems reviewed and are negative.  Physical Exam Updated Vital Signs BP 135/71    Pulse 86    Temp 98.2 F (36.8 C) (Oral)    Resp 14    Ht 6\' 1"  (1.854 m)    Wt 99.8 kg    SpO2 98%    BMI 29.03 kg/m   Physical Exam Vitals and nursing note reviewed.  Constitutional:      General: He is not in acute distress.    Appearance: Normal appearance. He is well-developed.  HENT:     Head: Normocephalic and atraumatic.  Eyes:     Conjunctiva/sclera: Conjunctivae normal.     Pupils: Pupils are equal, round, and reactive to light.  Cardiovascular:     Rate and Rhythm: Tachycardia present. Rhythm irregular.     Heart sounds: Normal heart sounds.  Pulmonary:     Effort: Pulmonary effort is normal. No respiratory distress.     Breath sounds: Normal breath sounds.  Abdominal:     General: There is no distension.     Palpations: Abdomen is soft.     Tenderness: There is no abdominal tenderness.  Musculoskeletal:        General: No deformity.  Normal range of motion.     Cervical back: Normal range of motion and neck supple.  Skin:    General: Skin is warm and dry.  Neurological:     General: No focal deficit present.     Mental Status: He is alert and oriented to person, place, and time.    ED Results / Procedures / Treatments   Labs (all labs ordered are listed, but only abnormal results are displayed) Labs Reviewed  RESP PANEL BY RT-PCR (FLU A&B, COVID) [id number]  COMPREHENSIVE METABOLIC PANEL  ETHANOL  CBC WITH DIFFERENTIAL/PLATELET  PROTIME-INR  RAPID URINE DRUG SCREEN, HOSP PERFORMED  URINALYSIS, ROUTINE W REFLEX MICROSCOPIC  TROPONIN I (HIGH SENSITIVITY)    EKG EKG Interpretation  Date/Time:  Wednesday November 08 2021 12:03:14 EST Ventricular Rate:  132 PR Interval:    QRS Duration: 68 QT  Interval:  295 QTC Calculation: 422 R Axis:   -33 Text Interpretation: Atrial fibrillation Ventricular premature complex Left axis deviation Anterior infarct, old Confirmed by [facility] [id number]) on 11/08/2021 12:13:32 PM  Radiology DG Chest Port 1 View  Result Date: 11/08/2021 CLINICAL DATA:  Chest pain since last night along with SOB. Using bourbon and Viagra. Thinks he took 1 too many pills EXAM: PORTABLE CHEST 1 VIEW COMPARISON:  None. FINDINGS: The heart size and mediastinal contours are within normal limits. No focal consolidation. No visible pleural effusion or pneumothorax. The visualized skeletal structures are unremarkable. IMPRESSION: 1. No acute cardiopulmonary findings. 2. Low lung volumes without evidence of active disease in the chest. Electronically Signed   By: Maudry Mayhew M.D.   On: 11/08/2021 12:28    Procedures Procedures   Medications Ordered in ED Medications  diltiazem (CARDIZEM) 125 mg in dextrose 5% 125 mL (1 mg/mL) infusion (10 mg/hr Intravenous New Bag/Given 11/08/21 1225)    ED Course  I have reviewed the triage vital signs and the nursing notes.  Pertinent labs & imaging results that were available during my care of the patient were reviewed by me and considered in my medical decision making (see chart for details).    MDM Rules/Calculators/A&P                           MDM  MSE complete  [facility]rell was evaluated in Emergency Department on 11/08/2021 for the symptoms described in the history of present illness. He was evaluated in the context of the global COVID-19 pandemic, which necessitated consideration that the patient might be at risk for infection with the SARS-CoV-2 virus that causes COVID-19. Institutional protocols and algorithms that pertain to the evaluation of patients at risk for COVID-19 are in a state of rapid change based on information released by regulatory bodies including the CDC and federal and state organizations. These  policies and algorithms were followed during the patient's care in the ED.  Patient reported heavy recent use of alcohol.  Patient is presenting with apparent new onset A. fib with RVR.  Adenosine administered by EMS without improvement.  Patient given bolus of Cardizem and then initiated on Cardizem drip.  With Cardizem patient's heart rate is into the 120s.  Patient feels significantly improved.  Other screening labs are without significant abnormality - except for potassium of 2.9, CO2 of 20, and sodium of 131.  Patient will require admission.  Family medicine teaching service is aware of case.      Final Clinical Impression(s) / ED Diagnoses Final diagnoses:  Atrial  fibrillation with RVR ([place])    Rx / DC Orders ED Discharge Orders     None        Valarie Merino, MD 11/08/21 1356

## 2021-11-08 NOTE — Hospital Course (Addendum)
[facility]rey [facility]rell is a 65 y.o.male with a history of hypothyroidism, HTN, GERD, GAD, BPH, insomnia, alcohol abuse disorder and medication nonadherence who was admitted to the [facility] at [facility] for shortness of breath and chest pain. His hospital course is detailed below:  New onset A. fib with RVR Presents via EMS in A. fib with a rate of 180-200s.  This took place after significant alcohol intake since the day prior. Placed on diltiazem drip for rhythm control and then transition to p.o. Metoprolol. Cardiology was consulted. He converted back to NSR during his stay. Echo showed LVEF 65-70%, G2DD, no wall motion abnormalities.  Cardiology recommended eliquis 5mg  BID post d/c and 30 day event monitor for PAF. He is to follow up outpatient in Afib clinic.  Esophagitis Patient experienced a couple emesis episodes during admission.  CT abdomen/pelvis revealed esophagitis.  GI recommended EGD, which the pt opted for EGD in the outpatient setting. He was discharged with instructions to take Protonix 40mg  BID and to follow up with GI in upcoming weeks.   Alcohol Withdrawal  Continued with CIWA scoring and Ativan per CIWA protocol for alcohol withdrawal. Tolerated this well. Follow up outpatient for alcohol use. Alcohol cessation counseling and resources provided.  Right arm pain Pt noted to have right arm pain that is exacerbated when he is lying on it. Did not have any point tenderness or Tinel's sign and all ROM is normal. Improved with tylenol. Would benefit from outpatient workup. Advise neck and shoulder XR as part of workup.  Other chronic conditions were medically managed with home medications and formulary alternatives as necessary (BPH)  PCP Follow-up Recommendations: Consider repeat TSH testing outpatient. Elevated in the hospital to 5.297 Discussion about alcohol cessation  Follow up for esophagitis, needs EGD, also never had colonoscopy Follow up for AFib Right arm  pain outpatient workup (see above for recs), consider neurology if necessary Advise weaning off benzodiazepine use Consider HTN reevaluation. Normotensive without requiring medicaitons in inpatient setting.

## 2021-11-09 ENCOUNTER — Observation Stay (HOSPITAL_COMMUNITY): Payer: BC Managed Care – PPO

## 2021-11-09 DIAGNOSIS — K219 Gastro-esophageal reflux disease without esophagitis: Secondary | ICD-10-CM | POA: Diagnosis not present

## 2021-11-09 DIAGNOSIS — F10988 Alcohol use, unspecified with other alcohol-induced disorder: Secondary | ICD-10-CM

## 2021-11-09 DIAGNOSIS — R1310 Dysphagia, unspecified: Secondary | ICD-10-CM

## 2021-11-09 DIAGNOSIS — I4891 Unspecified atrial fibrillation: Secondary | ICD-10-CM

## 2021-11-09 DIAGNOSIS — R1010 Upper abdominal pain, unspecified: Secondary | ICD-10-CM

## 2021-11-09 DIAGNOSIS — K2100 Gastro-esophageal reflux disease with esophagitis, without bleeding: Secondary | ICD-10-CM

## 2021-11-09 DIAGNOSIS — R933 Abnormal findings on diagnostic imaging of other parts of digestive tract: Secondary | ICD-10-CM

## 2021-11-09 LAB — CBC
HCT: 41.7 % (ref 39.0–52.0)
Hemoglobin: 14.2 g/dL (ref 13.0–17.0)
MCH: 32.6 pg (ref 26.0–34.0)
MCHC: 34.1 g/dL (ref 30.0–36.0)
MCV: 95.6 fL (ref 80.0–100.0)
Platelets: 247 10*3/uL (ref 150–400)
RBC: 4.36 MIL/uL (ref 4.22–5.81)
RDW: 12.5 % (ref 11.5–15.5)
WBC: 7.6 10*3/uL (ref 4.0–10.5)
nRBC: 0 % (ref 0.0–0.2)

## 2021-11-09 LAB — COMPREHENSIVE METABOLIC PANEL
ALT: 33 U/L (ref 0–44)
AST: 39 U/L (ref 15–41)
Albumin: 3.2 g/dL — ABNORMAL LOW (ref 3.5–5.0)
Alkaline Phosphatase: 43 U/L (ref 38–126)
Anion gap: 11 (ref 5–15)
BUN: 10 mg/dL (ref 8–23)
CO2: 20 mmol/L — ABNORMAL LOW (ref 22–32)
Calcium: 8.5 mg/dL — ABNORMAL LOW (ref 8.9–10.3)
Chloride: 98 mmol/L (ref 98–111)
Creatinine, Ser: 1.05 mg/dL (ref 0.61–1.24)
GFR, Estimated: >60 mL/min (ref >60)
Glucose, Bld: 85 mg/dL (ref 70–99)
Potassium: 4 mmol/L (ref 3.5–5.1)
Sodium: 129 mmol/L — ABNORMAL LOW (ref 135–145)
Total Bilirubin: 1.7 mg/dL — ABNORMAL HIGH (ref 0.3–1.2)
Total Protein: 6.3 g/dL — ABNORMAL LOW (ref 6.5–8.1)

## 2021-11-09 LAB — ECHOCARDIOGRAM COMPLETE
AR max vel: 2.67 cm2
AV Area VTI: 2.88 cm2
AV Area mean vel: 2.57 cm2
AV Mean grad: 3 mmHg
AV Peak grad: 5.8 mmHg
Ao pk vel: 1.2 m/s
Area-P 1/2: 3.89 cm2
Height: 73 in
S' Lateral: 3.1 cm
Weight: 3627.2 oz

## 2021-11-09 LAB — HEPARIN LEVEL (UNFRACTIONATED)
Heparin Unfractionated: 0.2 IU/mL — ABNORMAL LOW (ref 0.30–0.70)
Heparin Unfractionated: 0.54 IU/mL (ref 0.30–0.70)
Heparin Unfractionated: 0.42 IU/mL (ref 0.30–0.70)

## 2021-11-09 LAB — MAGNESIUM: Magnesium: 1.5 mg/dL — ABNORMAL LOW (ref 1.7–2.4)

## 2021-11-09 MED ORDER — METOPROLOL TARTRATE 12.5 MG HALF TABLET: 12.5000 mg | ORAL_TABLET | Freq: Three times a day (TID) | ORAL | Status: DC

## 2021-11-09 MED ORDER — DILTIAZEM HCL-DEXTROSE 125-5 MG/125ML-% IV SOLN (PREMIX)
5.0000 mg/h | INTRAVENOUS | Status: DC
  Filled 2021-11-09: qty 125

## 2021-11-09 MED ORDER — METOPROLOL TARTRATE 25 MG PO TABS
25.0000 mg | ORAL_TABLET | Freq: Two times a day (BID) | ORAL | Status: DC
  Administered 2021-11-09 – 2021-11-10 (×3): 25 mg via ORAL
  Filled 2021-11-09 (×3): qty 1

## 2021-11-09 MED ORDER — PANTOPRAZOLE SODIUM 40 MG PO TBEC
40.0000 mg | DELAYED_RELEASE_TABLET | Freq: Two times a day (BID) | ORAL | Status: DC
  Administered 2021-11-09 – 2021-11-10 (×2): 40 mg via ORAL
  Filled 2021-11-09 (×2): qty 1

## 2021-11-09 MED ORDER — METOPROLOL TARTRATE 25 MG PO TABS
25.0000 mg | ORAL_TABLET | Freq: Two times a day (BID) | ORAL | Status: DC
Start: 2021-11-09 — End: 2021-11-09

## 2021-11-09 MED ORDER — HEPARIN BOLUS VIA INFUSION
1500.0000 [IU] | Freq: Once | INTRAVENOUS | Status: AC
  Administered 2021-11-09: 1500 [IU] via INTRAVENOUS
  Filled 2021-11-09: qty 1500

## 2021-11-09 MED ORDER — MAGNESIUM SULFATE 2 GM/50ML IV SOLN
2.0000 g | Freq: Once | INTRAVENOUS | Status: AC
  Administered 2021-11-09: 2 g via INTRAVENOUS
  Filled 2021-11-09: qty 50

## 2021-11-09 NOTE — Progress Notes (Signed)
°  Echocardiogram 2D Echocardiogram has been performed.  Jeffrey [facility]rell 11/09/2021, 11:58 AM

## 2021-11-09 NOTE — Evaluation (Signed)
Occupational Therapy Evaluation Patient Details Name: Jeffrey Farrell MRN: [id number] DOB: 01-30-1956 Today's Date: 11/09/2021   History of Present Illness 65 y.o. male presenting 11/08/21 with shortness of breath and chest pain and found to be in Afib w/ RVR. Admitted for observation. PMH is significant for hypothyroidism, HTN, GERD, GAD, BPH, insomnia, alcohol abuse disorder and medication nonadherence.   Clinical Impression   Pt is functioning at a supervision level due to lines and impulsivity. No OT needs.     Recommendations for follow up therapy are one component of a multi-disciplinary discharge planning process, led by the attending physician.  Recommendations may be updated based on patient status, additional functional criteria and insurance authorization.   Follow Up Recommendations  No OT follow up    Assistance Recommended at Discharge None  Functional Status Assessment  Patient has had a recent decline in their functional status and demonstrates the ability to make significant improvements in function in a reasonable and predictable amount of time.  Equipment Recommendations  None recommended by OT    Recommendations for Other Services       Precautions / Restrictions Precautions Precautions: Fall Precaution Comments: poor awareness of lines Restrictions Weight Bearing Restrictions: No      Mobility Bed Mobility Overal bed mobility: Modified Independent             General bed mobility comments: HOB up    Transfers Overall transfer level: Independent Equipment used: None               General transfer comment: supervision for lines      Balance Overall balance assessment: No apparent balance deficits (not formally assessed)                                         ADL either performed or assessed with clinical judgement   ADL                                         General ADL Comments: supervision  in this environment due to lines and impulsivity     Vision Patient Visual Report: No change from baseline       Perception     Praxis      Pertinent Vitals/Pain Pain Assessment: No/denies pain Faces Pain Scale: Hurts even more Pain Location: R UE nerve pain Pain Descriptors / Indicators: Burning;Sharp;Other (Comment) (searing) Pain Intervention(s): Limited activity within patient's tolerance;Monitored during session;Repositioned     Hand Dominance Right   Extremity/Trunk Assessment Upper Extremity Assessment Upper Extremity Assessment: Overall WFL for tasks assessed   Lower Extremity Assessment Lower Extremity Assessment: Defer to PT evaluation   Cervical / Trunk Assessment Cervical / Trunk Assessment: Normal   Communication Communication Communication: No difficulties   Cognition Arousal/Alertness: Awake/alert Behavior During Therapy: Impulsive Overall Cognitive Status: Impaired/Different from baseline Area of Impairment: Safety/judgement                         Safety/Judgement: Decreased awareness of safety     General Comments: likely baseline     General Comments  VSS on RA    Exercises     Shoulder Instructions      Home Living Family/patient expects to be discharged to:: Private residence Living Arrangements: Spouse/significant  other Available Help at Discharge: Available PRN/intermittently Type of Home: House Home Access: Level entry;Other (comment)     Home Layout: Two level;Bed/bath upstairs Alternate Level Stairs-Number of Steps: 15   Bathroom Shower/Tub: Chief Strategy Officer: Standard Bathroom Accessibility: Yes   Home Equipment: None          Prior Functioning/Environment Prior Level of Function : Independent/Modified Independent               ADLs Comments: retired, cares for his mother in ALF        OT Problem List:        OT Treatment/Interventions:      OT Goals(Current goals can be  found in the care plan section)    OT Frequency:     Barriers to D/C:            Co-evaluation              AM-PAC OT "6 Clicks" Daily Activity     Outcome Measure Help from another person eating meals?: None Help from another person taking care of personal grooming?: None Help from another person toileting, which includes using toliet, bedpan, or urinal?: None Help from another person bathing (including washing, rinsing, drying)?: None Help from another person to put on and taking off regular upper body clothing?: None Help from another person to put on and taking off regular lower body clothing?: None 6 Click Score: 24   End of Session    Activity Tolerance: Patient tolerated treatment well Patient left: in bed;with call bell/phone within reach  OT Visit Diagnosis: Other symptoms and signs involving cognitive function                Time: 1250-1310 OT Time Calculation (min): 20 min Charges:  OT General Charges $OT Visit: 1 Visit OT Evaluation $OT Eval Low Complexity: 1 Low Martie Round, OTR/L Acute Rehabilitation Services Pager: [phone] Office: [phone]  Evern Bio 11/09/2021, 1:49 PM

## 2021-11-09 NOTE — Progress Notes (Signed)
CSW received consult for substance use resources for patient. CSW spoke with patient at bedside. CSW offered patient outpatient substance use treatment services resources. Patient accepted. All questions answered. No further questions at this time. 

## 2021-11-09 NOTE — Progress Notes (Signed)
ANTICOAGULATION CONSULT NOTE - Initial Consult  Pharmacy Consult for heparin Indication: atrial fibrillation  No Known Allergies  Patient Measurements: Height: 6\' 1"  (185.4 cm) Weight: 99.8 kg (220 lb) IBW/kg (Calculated) : 79.9 Heparin Dosing Weight: 99.8 kg   Vital Signs: BP: 121/51 (12/15 0200) Pulse Rate: 103 (12/15 0200)  Labs: Recent Labs    11/08/21 1212 11/08/21 1630 11/09/21 0149  HGB 15.4  --  14.2  HCT 44.1  --  41.7  PLT 265  --  247  LABPROT 13.2  --   --   INR 1.0  --   --   HEPARINUNFRC  --   --  0.20*  CREATININE 1.05  --   --   TROPONINIHS 12 14  --      Estimated Creatinine Clearance: 88.4 mL/min (by C-G formula based on SCr of 1.05 mg/dL).   Medical History: Past Medical History:  Diagnosis Date   Asthma    Hypertension    Thyroid disease      Assessment: 49 YOM with new onset Afib RVR. Pharmacy consulted to start IV heparin for primary stroke prophylaxis.  -initial heparin level= 0.2 on 1500 units/hr  Goal of Therapy:  Heparin level 0.3-0.7 units/ml Monitor platelets by anticoagulation protocol: Yes   Plan:  -heparin 1500 units IV bolus then increase infusion to 1700 units/hr -Heparin level in 6 hours and daily wth CBC daily  77, PharmD Clinical Pharmacist **Pharmacist phone directory can now be found on [email or web address] ([email or web address]).  Listed under [facility].

## 2021-11-09 NOTE — Consult Note (Signed)
CARDIOLOGY CONSULT NOTE       Patient ID: Scharlene CornJeffrey R Manigo MRN: [id number] DOB/AGE: 64/03/1956 64 y.o.  Admit date: 11/08/2021 Referring Physician: [facility]  Primary Physician: April MansonWhite, Marsha L, NP Primary Cardiologist: New  Reason for Consultation: PAF  Principal Problem:   Atrial fibrillation with RVR (HCC) Active Problems:   Alcohol use, unspecified with other alcohol-induced disorder (HCC)   Abdominal pain   Dysphagia   Gastroesophageal reflux disease   Abnormal finding on GI tract imaging   HPI:  65 y.o. admitted with palpitations found to be in rapid afib. 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 Drinks bourbon daily until admission. He was started on heparin and cardizem he has converted to NSR No chest pain , dyspnea , TIA or syncope TTE just done reviewed and normal EF no significant valve dx AV sclerosis and moderate LAE. He has had GERD/esophagitis symptoms with thickening of mucosa on CT and some dysphagia GI thinking of doing EGD.  Labs normal r/o and TSH in 5 range   ROS All other systems reviewed and negative except as noted above  Past Medical History:  Diagnosis Date   Asthma    Hypertension    Thyroid disease     No family history on file.  Social History   Socioeconomic History   Marital status: Significant Other    Spouse name: Not on file   Number of children: Not on file   Years of education: Not on file   Highest education level: Not on file  Occupational History   Not on file  Tobacco Use   Smoking status: Former    Types: Cigarettes   Smokeless tobacco: Never  Substance and Sexual Activity   Alcohol use: Yes   Drug use: Yes   Sexual activity: Yes  Other Topics Concern   Not on file  Social History Narrative   Not on file   Social Determinants of Health   Financial Resource Strain: Not on file  Food Insecurity: Not on file  Transportation Needs: Not on file   Physical Activity: Not on file  Stress: Not on file  Social Connections: Not on file  Intimate Partner Violence: Not on file    Past Surgical History:  Procedure Laterality Date   TONSILLECTOMY        Current Facility-Administered Medications:    folic acid (FOLVITE) tablet 1 mg, 1 mg, Oral, Daily, Dahbura, Anton, DO, 1 mg at 11/09/21 1236   heparin ADULT infusion 100 units/mL (25000 units/27050mL), 1,700 Units/hr, Intravenous, Continuous, Silvana NewnessMeyer, Andrew D, [facility], Last Rate: 17 mL/hr at 11/09/21 0947, 1,700 Units/hr at 11/09/21 0947   levothyroxine (SYNTHROID) tablet 150 mcg, 150 mcg, Oral, QAC breakfast, Shelby MattocksDahbura, Anton, DO, 150 mcg at 11/09/21 0602   LORazepam (ATIVAN) tablet 1-4 mg, 1-4 mg, Oral, Q1H PRN **OR** LORazepam (ATIVAN) injection 1-4 mg, 1-4 mg, Intravenous, Q1H PRN, Shelby Mattocksahbura, Anton, DO, 1 mg at 11/08/21 2318   metoprolol tartrate (LOPRESSOR) tablet 25 mg, 25 mg, Oral, BID, Darnelle SpangleSanford, James B, MD, 25 mg at 11/09/21 1236   multivitamin with minerals tablet 1 tablet, 1 tablet, Oral, Daily, Shelby MattocksDahbura, Anton, DO, 1 tablet at 11/09/21 0907   pantoprazole (PROTONIX) EC tablet 40 mg, 40 mg, Oral, BID, Gribbin, Sarah J, PA-C   tamsulosin [facility](FLOMAX) capsule 0.4 mg, 0.4 mg, Oral, Daily, Shelby MattocksDahbura, Anton, DO, 0.4 mg at 11/09/21 0908   thiamine tablet 100 mg, 100  mg, Oral, Daily, 100 mg at 11/09/21 0907 **OR** thiamine (B-1) injection 100 mg, 100 mg, Intravenous, Daily, Dahbura, Anton, DO  folic acid  1 mg Oral Daily   levothyroxine  150 mcg Oral QAC breakfast   metoprolol tartrate  25 mg Oral BID   multivitamin with minerals  1 tablet Oral Daily   pantoprazole  40 mg Oral BID   tamsulosin  0.4 mg Oral Daily   thiamine  100 mg Oral Daily   Or   thiamine  100 mg Intravenous Daily    heparin 1,700 Units/hr (11/09/21 0947)    Physical Exam: Blood pressure 134/67, pulse 78, temperature 98.6 F (37 C), temperature source Oral, resp. rate 19, height 6\' 1"  (1.854 m), weight 102.8 kg, SpO2 91 %.     Affect appropriate Healthy:  appears stated age HEENT: normal Neck supple with no adenopathy JVP normal no bruits no thyromegaly Lungs clear with no wheezing and good diaphragmatic motion Heart:  S1/S2 no murmur, no rub, gallop or click PMI normal Abdomen: benighn, BS positve, no tenderness, no AAA no bruit.  No HSM or HJR Distal pulses intact with no bruits No edema Neuro non-focal Skin warm and dry No muscular weakness   Labs:   Lab Results  Component Value Date   WBC 7.6 11/09/2021   HGB 14.2 11/09/2021   HCT 41.7 11/09/2021   MCV 95.6 11/09/2021   PLT 247 11/09/2021    Recent Labs  Lab 11/09/21 0149  NA 129*  K 4.0  CL 98  CO2 20*  BUN 10  CREATININE 1.05  CALCIUM 8.5*  PROT 6.3*  BILITOT 1.7*  ALKPHOS 43  ALT 33  AST 39  GLUCOSE 85   No results found for: CKTOTAL, CKMB, CKMBINDEX, TROPONINI No results found for: CHOL No results found for: HDL No results found for: LDLCALC No results found for: TRIG No results found for: CHOLHDL No results found for: LDLDIRECT    Radiology: CT ABDOMEN PELVIS WO CONTRAST  Result Date: 11/08/2021 CLINICAL DATA:  Acute generalized abdominal pain, nausea, vomiting. EXAM: CT ABDOMEN AND PELVIS WITHOUT CONTRAST TECHNIQUE: Multidetector CT imaging of the abdomen and pelvis was performed following the standard protocol without IV contrast. COMPARISON:  None. FINDINGS: Lower chest: Visualized lung bases are unremarkable. Wall thickening of distal esophagus is noted concerning for esophagitis. Hepatobiliary: Hepatic steatosis is noted. No gallstones, gallbladder wall thickening, or biliary dilatation. Pancreas: Unremarkable. No pancreatic ductal dilatation or surrounding inflammatory changes. Spleen: Normal in size without focal abnormality. Adrenals/Urinary Tract: Adrenal glands are unremarkable. Kidneys are normal, without renal calculi, focal lesion, or hydronephrosis. Bladder is unremarkable. Stomach/Bowel: Stomach is within  normal limits. Appendix appears normal. No evidence of bowel wall thickening, distention, or inflammatory changes. Vascular/Lymphatic: Aortic atherosclerosis. No enlarged abdominal or pelvic lymph nodes. Reproductive: Prostate is unremarkable. Other: No abdominal wall hernia or abnormality. No abdominopelvic ascites. Musculoskeletal: Status post bilateral total hip arthroplasties. No acute abnormality is noted. IMPRESSION: Wall thickening of distal esophagus is noted concerning for esophagitis. Endoscopy may be performed for further evaluation. Hepatic steatosis. Aortic Atherosclerosis (ICD10-I70.0). Electronically Signed   By: 11/10/2021 M.D.   On: 11/08/2021 17:35   DG Chest Port 1 View  Result Date: 11/08/2021 CLINICAL DATA:  Chest pain since last night along with SOB. Using bourbon and Viagra. Thinks he took 1 too many pills EXAM: PORTABLE CHEST 1 VIEW COMPARISON:  None. FINDINGS: The heart size and mediastinal contours are within normal limits. No focal consolidation. No  visible pleural effusion or pneumothorax. The visualized skeletal structures are unremarkable. IMPRESSION: 1. No acute cardiopulmonary findings. 2. Low lung volumes without evidence of active disease in the chest. Electronically Signed   By: Dahlia Bailiff M.D.   On: 11/08/2021 12:28    EKG: afib nonspecific ST changes    ASSESSMENT AND PLAN:   PAF:  telemetry shows conversion to SR rates 80's change to PO cardizem. See below discussed with GI continue heparin hold at 4:00 am for morning EGD then if no biopsy/dilatation can start eliquis 5 bid.  His CHADSVASC is only 1 for HTN will be 25 February 2022 when he turns 3 Favor 4 weeks of eliquis post d/c and 30 day event monitor for PAF. If he can stop drinking may not have a recurrence His echo shows normal EF and no valve dx other than AV sclerosis He does have moderate LAE.   Discussed with Dr Havery Moros Wrote order for nursing and pharmacy specifically to hold heparin at 4:00 am    Signed: Jenkins Rouge 11/09/2021, 2:18 PM

## 2021-11-09 NOTE — Progress Notes (Signed)
ANTICOAGULATION CONSULT NOTE  Pharmacy Consult for Heparin Indication: atrial fibrillation  No Known Allergies  Patient Measurements: Height: 6\' 1"  (185.4 cm) Weight: 102.8 kg (226 lb 11.2 oz) IBW/kg (Calculated) : 79.9  Heparin Dosing Weight: 100 kg  Vital Signs: Temp: 98.6 F (37 C) (12/15 1425) Temp Source: Oral (12/15 1425) BP: 116/63 (12/15 1425) Pulse Rate: 90 (12/15 1425)  Labs: Recent Labs    11/08/21 1212 11/08/21 1630 11/09/21 0149 11/09/21 0831  HGB 15.4  --  14.2  --   HCT 44.1  --  41.7  --   PLT 265  --  247  --   LABPROT 13.2  --   --   --   INR 1.0  --   --   --   HEPARINUNFRC  --   --  0.20* 0.54  CREATININE 1.05  --  1.05  --   TROPONINIHS 12 14  --   --     Estimated Creatinine Clearance: 89.6 mL/min (by C-G formula based on SCr of 1.05 mg/dL).   Assessment: 36 YOM who presented with new onset Afib RVR. Pharmacy consulted to start IV heparin for primary stroke prophylaxis. Initial heparin level 0.2 on 1500 units/hr. Heparin increased to 1700 units/hr. Repeat heparin level 0.54 . CBC stable, hgb 14.2, plts 247. No bleeding reported.   Goal of Therapy:  Heparin level 0.3-0.7 units/ml Monitor platelets by anticoagulation protocol: Yes  Plan:  Continue heparin infusion at 1700 units/hr Check heparin level in 6 hours and daily while on heparin Continue to monitor H&H and platelets    Thank you for allowing pharmacy to be a part of this patients care.  78, PharmD Clinical Pharmacist

## 2021-11-09 NOTE — ED Notes (Signed)
Patient is resting comfortably. 

## 2021-11-09 NOTE — Consult Note (Signed)
[facility] Consult: 10:22 AM 11/09/2021  LOS: 0 days    Referring Provider: Dr Dorris Singh  Primary Care Physician:  Jettie Booze, NP [facility] Primary Gastroenterologist:  unassigned    Reason for Consultation:  esophagitis   HPI: Jeffrey Farrell is a 65 y.o. male.  PMH alcohol abuse.  Erectile dysfunction.  Degenerative cervical spine disease/stenosis.  Bilateral carotid calcifications per cervical x-ray in 01/2020.  Left hip replacement 2018.  Transaminases in the 70s in February 2021, normal in late October 2022.  Hypothyroidism.  Hypertension.  Seen as a referral by Dr. Kaylyn Lim in July 2022 for abdominal bulging that PCP thought might be a ventral hernia.  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  Transported to ED yesterday morning via EMS.  Had had chest pain, shortness of breath all night long.  Drinking bourbon and using beyond prescribed amounts Viagra.  Initial heart rate in the 200s with underlying A. fib.  Treated with adenosine, Cardizem, heparin.  Cardizem and heparin drips remain in place. T bili 1.7.  Alk phos 43.  AST/ALT 39/33. Na 129.  BUN/creatinine normal.  Glucose normal. CBC entirely normal.  INR normal. CTAP w/O contrast: Hepatic steatosis.  Thickening of wall of distal esophagus.  Aortic atherosclerosis.  Patient has difficulty recalling how long he has had the symptoms but for several weeks he has had solid food dysphagia which is worse in the morning.  Feels like food is getting caught in the mid to distal esophagus and eventually will slowly pass if he drinks liquids.  No issues of regurgitation or nausea.  Although he is missing teeth he is able to chew his food well.  Has no dysphagia to liquids.  Normally  takes Protonix about 3 times a week to address reflux symptoms as needed.  No previous EGD or colonoscopy, no previous Cologuard screening.  Patient reports a great great grandfather who had colon cancer.  His father, his grandfather and his great grand father on the father side all had lung cancer, they were smokers.  Another grandfather had a brain aneurysm.  Patient alternates his alcohol intake between bourbon and red wine.  When drinking bourbon he can go through 1/2 gallon within 3 days.  When he is drinking wine he can go through 2-3 bottles in a day.  Previous smoker.  Retired from working in [employer]. [place] phone number is [phone] Significant other is Melina Copa phone [phone]    Past Medical History:  Diagnosis Date   Asthma    Hypertension    Thyroid disease     Past Surgical History:  Procedure Laterality Date   TONSILLECTOMY      Prior to Admission medications   Medication Sig Start Date End Date Taking? Authorizing Provider  albuterol (VENTOLIN HFA) 108 (90 Base) MCG/ACT inhaler Inhale 1-2 puffs into the lungs every 4 (four) hours as needed for wheezing. 05/18/21  Yes [provider]  amLODipine (NORVASC) 5 MG tablet Take  5 mg by mouth daily. 09/08/21  Yes [provider]  clonazePAM (KLONOPIN) 0.5 MG tablet Take 0.5 mg by mouth daily as needed for anxiety. 10/03/21  Yes [provider]  hydrochlorothiazide (HYDRODIURIL) 25 MG tablet Take 25 mg by mouth daily. 08/21/21  Yes [provider]  levothyroxine (SYNTHROID) 150 MCG tablet Take 150 mcg by mouth daily. 08/31/21  Yes [provider]  pantoprazole (PROTONIX) 40 MG tablet Take 40 mg by mouth daily as needed (indigestion). 04/15/20  Yes [provider]  sildenafil (VIAGRA) 100 MG tablet Take 100 mg by mouth daily as needed for erectile dysfunction. 10/16/21  Yes [provider]  tamsulosin (FLOMAX) 0.4 MG CAPS capsule  Take 0.4 mg by mouth daily. 09/19/21  Yes [provider]  temazepam (RESTORIL) 30 MG capsule Take 30 mg by mouth at bedtime. 10/31/21  Yes [provider]  chlordiazePOXIDE (LIBRIUM) 25 MG capsule 52m PO TID x 1D, then 25-567mPO BID X 1D, then 25-5086mO QD X 1D Patient not taking: Reported on 11/08/2021 07/05/20   LocCarmin MuskratD    Scheduled Meds:  folic acid  1 mg Oral Daily   levothyroxine  150 mcg Oral QAC breakfast   multivitamin with minerals  1 tablet Oral Daily   pantoprazole (PROTONIX) IV  40 mg Intravenous Q12H   tamsulosin  0.4 mg Oral Daily   thiamine  100 mg Oral Daily   Or   thiamine  100 mg Intravenous Daily   Infusions:  diltiazem (CARDIZEM) infusion 15 mg/hr (11/09/21 0617)   heparin 1,700 Units/hr (11/09/21 0947)   PRN Meds: LORazepam **OR** LORazepam   Allergies as of 11/08/2021   (No Known Allergies)    No family history on file.  Social History   Socioeconomic History   Marital status: Significant Other    Spouse name: Not on file   Number of children: Not on file   Years of education: Not on file   Highest education level: Not on file  Occupational History   Not on file  Tobacco Use   Smoking status: Former    Types: Cigarettes   Smokeless tobacco: Never  Substance and Sexual Activity   Alcohol use: Yes   Drug use: Yes   Sexual activity: Yes  Other Topics Concern   Not on file  Social History Narrative   Not on file   Social Determinants of Health   Financial Resource Strain: Not on file  Food Insecurity: Not on file  Transportation Needs: Not on file  Physical Activity: Not on file  Stress: Not on file  Social Connections: Not on file  Intimate Partner Violence: Not on file    REVIEW OF SYSTEMS: Constitutional: No weakness or fatigue.  No weight fluctuation of more than 5 pound ENT:  No nose bleeds Pulm: Shortness of breath resolved. CV:  No palpitations, no LE edema.  No current angina GU:  No  hematuria, no frequency GI: See HPI.  Bowel movements are brown. Heme: No unusual or excessive bleeding or bruising. Transfusions: None. Neuro:  No headaches, no peripheral tingling or numbness Derm:  No itching, no rash or sores.  Endocrine:  No sweats or chills.  No polyuria or dysuria Immunization: Reviewed. Travel: Traveled to the [place] recent months.   PHYSICAL EXAM: Vital signs in last 24 hours: Vitals:   11/09/21 0740 11/09/21 0815  BP: 129/79 111/66  Pulse: 76 87  Resp:  19  Temp:  98.6 F (  37 C)  SpO2: 95% 91%   Wt Readings from Last 3 Encounters:  11/09/21 102.8 kg    General: Pleasant, alert, comfortable, does not look ill. Head: No facial asymmetry or swelling.  No signs of head trauma. Eyes: No conjunctival pallor.  No scleral icterus.  EOMI Ears: Not hard of hearing Nose: No congestion or discharge Mouth: Moist, clear, pink mucosa.  Tongue midline.  Poor dentition with caries and missing teeth. Neck: No JVD, no masses, no thyromegaly Lungs: Clear bilaterally.  No labored breathing. Heart: Telemetry monitor with NSR in the 80s.  Audible rhythm is regular.  No MRG. Abdomen: Soft without tenderness.  No HSM, masses, bruits, hernias.   Rectal: Deferred Musc/Skeltl: No joint redness, swelling or gross deformity. Extremities: No CCE. Neurologic: Alert.  Appropriate.  Oriented x3.  Moves all 4 limbs without tremor or weakness. Skin: No telangiectasia, no suspicious lesions.  No rashes, no sores Nodes: No cervical adenopathy Psych: Cooperative, calm, pleasant, fluid speech.  Affect pleasant.  Intake/Output from previous day: 12/14 0701 - 12/15 0700 In: 487 [I.V.:32.3; IV Piggyback:454.7] Out: 300 [Urine:300] Intake/Output this shift: No intake/output data recorded.  LAB RESULTS: Recent Labs    11/08/21 1212 11/09/21 0149  WBC 7.7 7.6  HGB 15.4 14.2  HCT 44.1 41.7  PLT 265 247   BMET Lab Results  Component Value Date   NA 129 (L)  11/09/2021   NA 131 (L) 11/08/2021   NA 128 (L) 07/05/2020   K 4.0 11/09/2021   K 2.9 (L) 11/08/2021   K 3.3 (L) 07/05/2020   CL 98 11/09/2021   CL 95 (L) 11/08/2021   CL 89 (L) 07/05/2020   CO2 20 (L) 11/09/2021   CO2 20 (L) 11/08/2021   CO2 20 (L) 07/05/2020   GLUCOSE 85 11/09/2021   GLUCOSE 82 11/08/2021   GLUCOSE 136 (H) 07/05/2020   BUN 10 11/09/2021   BUN 10 11/08/2021   BUN 13 07/05/2020   CREATININE 1.05 11/09/2021   CREATININE 1.05 11/08/2021   CREATININE 1.10 07/05/2020   CALCIUM 8.5 (L) 11/09/2021   CALCIUM 8.9 11/08/2021   CALCIUM 9.4 07/05/2020   LFT Recent Labs    11/08/21 1212 11/09/21 0149  PROT 7.1 6.3*  ALBUMIN 3.8 3.2*  AST 58* 39  ALT 43 33  ALKPHOS 55 43  BILITOT 1.6* 1.7*   PT/INR Lab Results  Component Value Date   INR 1.0 11/08/2021   Hepatitis Panel No results for input(s): HEPBSAG, HCVAB, HEPAIGM, HEPBIGM in the last 72 hours. C-Diff No components found for: CDIFF Lipase     Component Value Date/Time   LIPASE 51 11/08/2021 1520    Drugs of Abuse     Component Value Date/Time   LABOPIA NONE DETECTED 11/08/2021 1830   COCAINSCRNUR NONE DETECTED 11/08/2021 1830   LABBENZ POSITIVE (A) 11/08/2021 1830   AMPHETMU NONE DETECTED 11/08/2021 1830   THCU NONE DETECTED 11/08/2021 1830   LABBARB NONE DETECTED 11/08/2021 1830     RADIOLOGY STUDIES: CT ABDOMEN PELVIS WO CONTRAST  Result Date: 11/08/2021 CLINICAL DATA:  Acute generalized abdominal pain, nausea, vomiting. EXAM: CT ABDOMEN AND PELVIS WITHOUT CONTRAST TECHNIQUE: Multidetector CT imaging of the abdomen and pelvis was performed following the standard protocol without IV contrast. COMPARISON:  None. FINDINGS: Lower chest: Visualized lung bases are unremarkable. Wall thickening of distal esophagus is noted concerning for esophagitis. Hepatobiliary: Hepatic steatosis is noted. No gallstones, gallbladder wall thickening, or biliary dilatation. Pancreas: Unremarkable. No pancreatic  ductal dilatation or  surrounding inflammatory changes. Spleen: Normal in size without focal abnormality. Adrenals/Urinary Tract: Adrenal glands are unremarkable. Kidneys are normal, without renal calculi, focal lesion, or hydronephrosis. Bladder is unremarkable. Stomach/Bowel: Stomach is within normal limits. Appendix appears normal. No evidence of bowel wall thickening, distention, or inflammatory changes. Vascular/Lymphatic: Aortic atherosclerosis. No enlarged abdominal or pelvic lymph nodes. Reproductive: Prostate is unremarkable. Other: No abdominal wall hernia or abnormality. No abdominopelvic ascites. Musculoskeletal: Status post bilateral total hip arthroplasties. No acute abnormality is noted. IMPRESSION: Wall thickening of distal esophagus is noted concerning for esophagitis. Endoscopy may be performed for further evaluation. Hepatic steatosis. Aortic Atherosclerosis (ICD10-I70.0). Electronically Signed   By: Marijo Conception M.D.   On: 11/08/2021 17:35   DG Chest Port 1 View  Result Date: 11/08/2021 CLINICAL DATA:  Chest pain since last night along with SOB. Using bourbon and Viagra. Thinks he took 1 too many pills EXAM: PORTABLE CHEST 1 VIEW COMPARISON:  None. FINDINGS: The heart size and mediastinal contours are within normal limits. No focal consolidation. No visible pleural effusion or pneumothorax. The visualized skeletal structures are unremarkable. IMPRESSION: 1. No acute cardiopulmonary findings. 2. Low lung volumes without evidence of active disease in the chest. Electronically Signed   By: Dahlia Bailiff M.D.   On: 11/08/2021 12:28     IMPRESSION:       Solid food dysphagia.  Esophagitis per noncontrast CT.  Takes Protonix periodically.  No previous EGD.     New onset A. fib, RVR in setting of excess alcohol and Viagra.  Now in NSR with Cardizem drip in place.  Heparin drip in place.   Plans regarding future anticoagulation not defined.    Alcoholism.  So far no signs of withdrawal.       PLAN:        EGD w possible esoph dilation d/w pt, time set for 10 AM tomorrow if Dr Havery Moros approves.  Will need off IV Heparin for 5 to 6 hours beforehand.  Also will need to no plans for anticoagulation going forward.  Going to advance diet to soft foods and n.p.o. after midnight,    Switch to Protonix 40 mg po bid.     Azucena Freed  11/09/2021, 10:22 AM Phone [phone]

## 2021-11-09 NOTE — Evaluation (Signed)
Physical Therapy Evaluation and Discharge Patient Details Name: Jeffrey Farrell MRN: [id number] DOB: [birth year] Today's Date: 11/09/2021  History of Present Illness  65 y.o. male presenting 11/08/21 with shortness of breath and chest pain and found to be in Afib w/ RVR. Admitted for observation. PMH is significant for hypothyroidism, HTN, GERD, GAD, BPH, insomnia, alcohol abuse disorder and medication nonadherence.  Clinical Impression  PTA pt living with fiance in multistory home with bed and bath upstairs. Pt reports independence in mobility, bADLs and iADLs. Pt limited in safe mobility by impulsivity and decreased safety awareness (almost pulling IV out with fast movement), in presence of decreased balance. Pt is mod I for bed mobility and transfers and supervision for ambulation. In hospital setting, pt requires maximal cuing for management of lines during mobility and patience with set up to move. Pt is likely at his baseline level of function and will not need further PT services at discharge. PT will refer to mobility team to maintain mobility. PT is discharging from caseload, however if pt has change in mobility status please reorder.      Recommendations for follow up therapy are one component of a multi-disciplinary discharge planning process, led by the attending physician.  Recommendations may be updated based on patient status, additional functional criteria and insurance authorization.  Follow Up Recommendations No PT follow up       Functional Status Assessment Patient has not had a recent decline in their functional status  Equipment Recommendations  None recommended by PT       Precautions / Restrictions Precautions Precautions: Fall Precaution Comments: reports 2x fall down stairs Restrictions Weight Bearing Restrictions: No      Mobility  Bed Mobility Overal bed mobility: Modified Independent             General bed mobility comments: requires incresed cuing  to slow down for management of lines    Transfers Overall transfer level: Modified independent Equipment used: None               General transfer comment: requires reminding for safety with lines    Ambulation/Gait Ambulation/Gait assistance: Supervision Gait Distance (Feet): 20 Feet Assistive device: None Gait Pattern/deviations: Step-through pattern;WFL(Within Functional Limits) Gait velocity: too fast for conditions Gait velocity interpretation: <1.8 ft/sec, indicate of risk for recurrent falls   General Gait Details: again cuing for safety, mild instability, no overt LoB         Balance Overall balance assessment: Mild deficits observed, not formally tested                                           Pertinent Vitals/Pain Pain Assessment: Faces Faces Pain Scale: Hurts even more Pain Location: R UE nerve pain Pain Descriptors / Indicators: Burning;Sharp;Other (Comment) (searing) Pain Intervention(s): Limited activity within patient's tolerance;Monitored during session;Repositioned    Home Living Family/patient expects to be discharged to:: Private residence Living Arrangements: Spouse/significant other   Type of Home: House Home Access: Level entry;Other (comment) (steep driveway)     Alternate Level Stairs-Number of Steps: 15 Home Layout: Two level;Bed/bath upstairs Home Equipment: None      Prior Function Prior Level of Function : Independent/Modified Independent                        Extremity/Trunk Assessment   Upper Extremity Assessment Upper Extremity  Assessment: Defer to OT evaluation    Lower Extremity Assessment Lower Extremity Assessment: Overall WFL for tasks assessed       Communication   Communication: No difficulties  Cognition Arousal/Alertness: Awake/alert Behavior During Therapy: Impulsive Overall Cognitive Status: Impaired/Different from baseline Area of Impairment: Safety/judgement                          Safety/Judgement: Decreased awareness of safety     General Comments: moves impulsively, has answer for everything        General Comments General comments (skin integrity, edema, etc.): VSS on RA        Assessment/Plan    PT Assessment Patient does not need any further PT services  PT Problem List Decreased cognition;Decreased safety awareness       PT Treatment Interventions      PT Goals (Current goals can be found in the Care Plan section)  Acute Rehab PT Goals Patient Stated Goal: go home PT Goal Formulation: With patient    Frequency Min 3X/week    AM-PAC PT "6 Clicks" Mobility  Outcome Measure Help needed turning from your back to your side while in a flat bed without using bedrails?: None Help needed moving from lying on your back to sitting on the side of a flat bed without using bedrails?: None Help needed moving to and from a bed to a chair (including a wheelchair)?: None Help needed standing up from a chair using your arms (e.g., wheelchair or bedside chair)?: None Help needed to walk in hospital room?: A Little Help needed climbing 3-5 steps with a railing? : A Little 6 Click Score: 22    End of Session   Activity Tolerance: Patient tolerated treatment well Patient left: in chair;with call bell/phone within reach;with chair alarm set Nurse Communication: Mobility status;Other (comment) (impulsive) PT Visit Diagnosis: Other abnormalities of gait and mobility (R26.89);Difficulty in walking, not elsewhere classified (R26.2);Repeated falls (R29.6);History of falling (Z91.81)    Time: [id number] PT Time Calculation (min) (ACUTE ONLY): 27 min   Charges:   PT Evaluation $PT Eval Moderate Complexity: 1 Mod PT Treatments $Therapeutic Activity: 8-22 mins        Jeffrey Farrell B. Jeffrey Farrell PT, DPT Acute Rehabilitation Services Pager [phone] Office [phone]   Jeffrey Farrell 11/09/2021, 11:19 AM

## 2021-11-09 NOTE — Progress Notes (Signed)
Family Medicine Teaching Service Daily Progress Note Intern Pager: [phone]  Patient name: Jeffrey Farrell Medical record number: [id number] Date of birth: 1956-09-07 Age: 65 y.o. Gender: male  Primary Care Provider: Jettie Booze, NP Consultants: GI Code Status: Full, however patient does not want to be dependent on machine  Pt Overview and Major Events to Date:  12/14: admitted to [facility] and Plan: Jeffrey Farrell is a 65 y.o. male presenting with shortness of breath and chest pain and found to be in Afib w/ RVR. PMH is significant for hypothyroidism, HTN, GERD, GAD, BPH, insomnia, alcohol abuse disorder and medication nonadherence.  New onset A. Fib Patient still in A. fib today but controlled rates 70s to 90s.  He has been on diltiazem drip since being in the ED. UDS only remarkable for benzos.  Patient denies any palpitations or shortness of breath at this time or overnight. -Cardiology consulted, appreciate recs -Heparin per pharmacy -PT/OT eval and treat -Echo pending -Discontinue diltiazem drip, transition to metoprolol tartrate 25mg  p.o. BID  Esophagitis   history of GERD Patient was noted to have wall thickening of distal esophagus concerning for esophagitis with consideration for endoscopy further evaluation on CT abdomen pelvis without contrast.  Patient is without nausea and vomiting since initial episode on upon presentation.  Patient still endorses epigastric discomfort but not pain with palpation.  GI has advised waiting for cardiology evaluation prior to pursuing EGD given he is currently being anticoagulated and potentially pursue this outpatient in addition to colonoscopy as patient has never had a colonoscopy previously. -GI consulted, appreciate recs -Transition Protonix to 40 mg twice daily p.o.  Alcohol use disorder Patient notes that he feels well this morning does not feel jittery.  CIWA scores overnight were 5, 6, 0, 0.  Patient required 2 mg of  Ativan overnight which also helped him sleep. -Continue CIWA protocol with corresponding Ativan treatment -Continue folate, thiamine, multivitamin -TOC consult for subs abuse counseling/education  Hypomagnesemia   Hyponatremia Magnesium 1.5 this AM.  Repleted with 2 g magnesium IV.  Hyponatremia 9 this AM.  This appears to be chronic and likely related to alcohol use.  Hyponatremia not requiring intervention at this time. -Magnesium and BMP tomorrow a.m.  FEN/GI: Heart healthy PPx: Heparin drip Dispo:Home pending clinical improvement . Barriers include A. fib, esophagitis work-up.   Subjective:  Patient states that he is done drinking bourbon and may drink red wine with his fiance.  Denies palpitations or shortness of breath, nausea or vomiting at this time.  He still endorses epigastric discomfort  Objective: Temp:  [98.2 F (36.8 C)-98.7 F (37.1 C)] 98.6 F (37 C) (12/15 0815) Pulse Rate:  [60-114] 87 (12/15 0815) Resp:  [14-24] 19 (12/15 0815) BP: (106-147)/(51-108) 111/66 (12/15 0815) SpO2:  [91 %-99 %] 91 % (12/15 0815) Weight:  [99.8 kg-102.8 kg] 102.8 kg (12/15 0528) Physical Exam: General: Awake and alert, NAD Cardiovascular: Irregularly irregular, no murmurs auscultated Respiratory: CTA B, no increased work of breathing Abdomen: Soft, distended obese abdomen, normoactive bowel sounds throughout  Laboratory: Recent Labs  Lab 11/08/21 1212 11/09/21 0149  WBC 7.7 7.6  HGB 15.4 14.2  HCT 44.1 41.7  PLT 265 247   Recent Labs  Lab 11/08/21 1212 11/09/21 0149  NA 131* 129*  K 2.9* 4.0  CL 95* 98  CO2 20* 20*  BUN 10 10  CREATININE 1.05 1.05  CALCIUM 8.9 8.5*  PROT 7.1 6.3*  BILITOT 1.6* 1.7*  ALKPHOS 55  43  ALT 43 33  AST 58* 39  GLUCOSE 82 85    Mag: 1.5  Imaging/Diagnostic Tests: CT ABDOMEN PELVIS WO CONTRAST  Result Date: 11/08/2021 CLINICAL DATA:  Acute generalized abdominal pain, nausea, vomiting. EXAM: CT ABDOMEN AND PELVIS WITHOUT CONTRAST  TECHNIQUE: Multidetector CT imaging of the abdomen and pelvis was performed following the standard protocol without IV contrast. COMPARISON:  None. FINDINGS: Lower chest: Visualized lung bases are unremarkable. Wall thickening of distal esophagus is noted concerning for esophagitis. Hepatobiliary: Hepatic steatosis is noted. No gallstones, gallbladder wall thickening, or biliary dilatation. Pancreas: Unremarkable. No pancreatic ductal dilatation or surrounding inflammatory changes. Spleen: Normal in size without focal abnormality. Adrenals/Urinary Tract: Adrenal glands are unremarkable. Kidneys are normal, without renal calculi, focal lesion, or hydronephrosis. Bladder is unremarkable. Stomach/Bowel: Stomach is within normal limits. Appendix appears normal. No evidence of bowel wall thickening, distention, or inflammatory changes. Vascular/Lymphatic: Aortic atherosclerosis. No enlarged abdominal or pelvic lymph nodes. Reproductive: Prostate is unremarkable. Other: No abdominal wall hernia or abnormality. No abdominopelvic ascites. Musculoskeletal: Status post bilateral total hip arthroplasties. No acute abnormality is noted. IMPRESSION: Wall thickening of distal esophagus is noted concerning for esophagitis. Endoscopy may be performed for further evaluation. Hepatic steatosis. Aortic Atherosclerosis (ICD10-I70.0). Electronically Signed   By: Lupita Raider M.D.   On: 11/08/2021 17:35   DG Chest Port 1 View  Result Date: 11/08/2021 CLINICAL DATA:  Chest pain since last night along with SOB. Using bourbon and Viagra. Thinks he took 1 too many pills EXAM: PORTABLE CHEST 1 VIEW COMPARISON:  None. FINDINGS: The heart size and mediastinal contours are within normal limits. No focal consolidation. No visible pleural effusion or pneumothorax. The visualized skeletal structures are unremarkable. IMPRESSION: 1. No acute cardiopulmonary findings. 2. Low lung volumes without evidence of active disease in the chest.  Electronically Signed   By: Maudry Mayhew M.D.   On: 11/08/2021 12:28     Shelby Mattocks, DO 11/09/2021, 9:36 AM PGY-1, [facility] FPTS Intern pager: [phone], text pages welcome

## 2021-11-09 NOTE — Progress Notes (Signed)
Admission  Admitted from ED for Afib RVR; transported via stretcher with nurse. Cardizem and heparin drips infusing. Assessment and vitals completed. Pain and I&O assessed. Patient oriented to room and staff. Placed on continuous cardiac monitoring. Meds given per order. Call bell within reach. Will continue to monitor.

## 2021-11-09 NOTE — Progress Notes (Signed)
Patient declined the EGD tomorrow. Dr Adela Lank notified via secure chat.

## 2021-11-09 NOTE — Progress Notes (Addendum)
ANTICOAGULATION CONSULT NOTE - Follow Up Consult  Pharmacy Consult for IV Heparin Indication: atrial fibrillation  No Known Allergies  Patient Measurements: Height: 6\' 1"  (185.4 cm) Weight: 102.8 kg (226 lb 11.2 oz) IBW/kg (Calculated) : 79.9 Heparin Dosing Weight: 100 kg  Vital Signs: Temp: 98.6 F (37 C) (12/15 1425) Temp Source: Oral (12/15 1425) BP: 116/63 (12/15 1425) Pulse Rate: 90 (12/15 1425)  Labs: Recent Labs    11/08/21 1212 11/08/21 1630 11/09/21 0149 11/09/21 0831 11/09/21 1536  HGB 15.4  --  14.2  --   --   HCT 44.1  --  41.7  --   --   PLT 265  --  247  --   --   LABPROT 13.2  --   --   --   --   INR 1.0  --   --   --   --   HEPARINUNFRC  --   --  0.20* 0.54 0.42  CREATININE 1.05  --  1.05  --   --   TROPONINIHS 12 14  --   --   --     Estimated Creatinine Clearance: 89.6 mL/min (by C-G formula based on SCr of 1.05 mg/dL).   Assessment: 65 yr old man presented with new onset Afib with RVR. Pharmacy was consulted to start IV heparin for primary stroke prophylaxis.   Pt had therapeutic heparin level  (0.54 units/ml) earlier today on heparin infusion at 1700 units/hr. Confirmatory heparin level ~7 hrs later remains therapeutic at 0.42 units/ml). H/H 14.2/41.7, plt 247. Per RN, no issues with IV or bleeding observed.  Per GI/cardiology notes, planning EDG at 0845 AM tomorrow, with plan to hold heparin infusion 4 hrs before procedure.  Goal of Therapy:  Heparin level 0.3-0.7 units/ml Monitor platelets by anticoagulation protocol: Yes  Plan:  Continue heparin infusion at 1700 units/hr Hold heparin at 0400 tomorrow (12/16) for EGD Monitor daily heparin level, CBC Monitor for bleeding F/U long term anticoagulation plan   Thank you for allowing pharmacy to be a part of this patients care.  11-05-1996, PharmD, BCPS, [facility] Clinical Pharmacist

## 2021-11-10 ENCOUNTER — Other Ambulatory Visit (HOSPITAL_COMMUNITY): Payer: Self-pay

## 2021-11-10 ENCOUNTER — Telehealth: Payer: Self-pay

## 2021-11-10 ENCOUNTER — Encounter (HOSPITAL_COMMUNITY): Payer: Self-pay | Admitting: Family Medicine

## 2021-11-10 DIAGNOSIS — I480 Paroxysmal atrial fibrillation: Secondary | ICD-10-CM | POA: Diagnosis not present

## 2021-11-10 DIAGNOSIS — K219 Gastro-esophageal reflux disease without esophagitis: Secondary | ICD-10-CM | POA: Diagnosis not present

## 2021-11-10 DIAGNOSIS — R933 Abnormal findings on diagnostic imaging of other parts of digestive tract: Secondary | ICD-10-CM | POA: Diagnosis not present

## 2021-11-10 DIAGNOSIS — Z20822 Contact with and (suspected) exposure to covid-19: Secondary | ICD-10-CM | POA: Diagnosis not present

## 2021-11-10 DIAGNOSIS — R1310 Dysphagia, unspecified: Secondary | ICD-10-CM | POA: Diagnosis not present

## 2021-11-10 DIAGNOSIS — I4891 Unspecified atrial fibrillation: Secondary | ICD-10-CM | POA: Diagnosis not present

## 2021-11-10 DIAGNOSIS — F10988 Alcohol use, unspecified with other alcohol-induced disorder: Secondary | ICD-10-CM | POA: Diagnosis not present

## 2021-11-10 DIAGNOSIS — J45909 Unspecified asthma, uncomplicated: Secondary | ICD-10-CM | POA: Diagnosis not present

## 2021-11-10 DIAGNOSIS — I10 Essential (primary) hypertension: Secondary | ICD-10-CM | POA: Diagnosis not present

## 2021-11-10 LAB — BASIC METABOLIC PANEL
Anion gap: 7 (ref 5–15)
BUN: 10 mg/dL (ref 8–23)
CO2: 26 mmol/L (ref 22–32)
Calcium: 8.8 mg/dL — ABNORMAL LOW (ref 8.9–10.3)
Chloride: 100 mmol/L (ref 98–111)
Creatinine, Ser: 1.11 mg/dL (ref 0.61–1.24)
GFR, Estimated: >60 mL/min (ref >60)
Glucose, Bld: 89 mg/dL (ref 70–99)
Potassium: 4.3 mmol/L (ref 3.5–5.1)
Sodium: 133 mmol/L — ABNORMAL LOW (ref 135–145)

## 2021-11-10 LAB — CBC
HCT: 40.7 % (ref 39.0–52.0)
Hemoglobin: 14.1 g/dL (ref 13.0–17.0)
MCH: 32.9 pg (ref 26.0–34.0)
MCHC: 34.6 g/dL (ref 30.0–36.0)
MCV: 95.1 fL (ref 80.0–100.0)
Platelets: 224 10*3/uL (ref 150–400)
RBC: 4.28 MIL/uL (ref 4.22–5.81)
RDW: 12.5 % (ref 11.5–15.5)
WBC: 5.6 10*3/uL (ref 4.0–10.5)
nRBC: 0 % (ref 0.0–0.2)

## 2021-11-10 LAB — MAGNESIUM: Magnesium: 2.3 mg/dL (ref 1.7–2.4)

## 2021-11-10 SURGERY — ESOPHAGOGASTRODUODENOSCOPY (EGD) WITH PROPOFOL
Anesthesia: Monitor Anesthesia Care

## 2021-11-10 MED ORDER — THIAMINE HCL 100 MG PO TABS: 100.0000 mg | ORAL_TABLET | Freq: Every day | ORAL | 0 refills | Status: AC

## 2021-11-10 MED ORDER — ADULT MULTIVITAMIN W/MINERALS CH: 1.0000 | ORAL_TABLET | Freq: Every day | ORAL | 0 refills | Status: DC

## 2021-11-10 MED ORDER — HEPARIN (PORCINE) 25000 UT/250ML-% IV SOLN: 1700.0000 [IU]/h | INTRAVENOUS | Status: DC

## 2021-11-10 MED ORDER — APIXABAN 5 MG PO TABS
5.0000 mg | ORAL_TABLET | Freq: Two times a day (BID) | ORAL | Status: DC
  Administered 2021-11-10: 5 mg via ORAL
  Filled 2021-11-10: qty 1

## 2021-11-10 MED ORDER — METOPROLOL TARTRATE 25 MG PO TABS: 25.0000 mg | ORAL_TABLET | Freq: Two times a day (BID) | ORAL | 0 refills | Status: DC

## 2021-11-10 MED ORDER — LEVOTHYROXINE SODIUM 150 MCG PO TABS: 150.0000 ug | ORAL_TABLET | Freq: Every day | ORAL | 0 refills | Status: AC

## 2021-11-10 MED ORDER — ACETAMINOPHEN 325 MG PO TABS
650.0000 mg | ORAL_TABLET | Freq: Once | ORAL | Status: AC
  Administered 2021-11-10: 650 mg via ORAL
  Filled 2021-11-10: qty 2

## 2021-11-10 MED ORDER — ACETAMINOPHEN 325 MG PO TABS: 650.0000 mg | ORAL_TABLET | Freq: Four times a day (QID) | ORAL | Status: DC | PRN

## 2021-11-10 MED ORDER — FOLIC ACID 1 MG PO TABS: 1.0000 mg | ORAL_TABLET | Freq: Every day | ORAL | 0 refills | Status: AC

## 2021-11-10 MED ORDER — APIXABAN 5 MG PO TABS: 5.0000 mg | ORAL_TABLET | Freq: Two times a day (BID) | ORAL | 0 refills | Status: DC

## 2021-11-10 MED ORDER — PANTOPRAZOLE SODIUM 40 MG PO TBEC: 40.0000 mg | DELAYED_RELEASE_TABLET | Freq: Two times a day (BID) | ORAL | 0 refills | Status: DC

## 2021-11-10 MED ORDER — TAMSULOSIN HCL 0.4 MG PO CAPS
0.4000 mg | ORAL_CAPSULE | Freq: Every day | ORAL | 0 refills | Status: AC
Start: 2021-11-10 — End: ?

## 2021-11-10 NOTE — Progress Notes (Signed)
Progress Note   Subjective  Patient declining EGD this AM, states he is not up for it and wants to go home and do it as an outpatient. Received multiple doses of ativan overnight for CIWA > 15. Remains in normal sinus rhythm.   Objective   Vital signs in last 24 hours: Temp:  [98.2 F (36.8 C)-98.6 F (37 C)] 98.6 F (37 C) (12/16 0615) Pulse Rate:  [69-90] 87 (12/16 0618) Resp:  [17-19] 18 (12/16 0615) BP: (111-143)/(62-91) 126/91 (12/16 0618) SpO2:  [87 %-97 %] 94 % (12/16 0618)   General:    white male in NAD Neurologic:  Alert and oriented,  grossly normal neurologically. Psych:  Cooperative. Normal mood and affect.  Intake/Output from previous day: 12/15 0701 - 12/16 0700 In: 796.9 [P.O.:240; I.V.:556.9] Out: 2350 [Urine:2350] Intake/Output this shift: No intake/output data recorded.  Lab Results: Recent Labs    11/08/21 1212 11/09/21 0149 11/10/21 0335  WBC 7.7 7.6 5.6  HGB 15.4 14.2 14.1  HCT 44.1 41.7 40.7  PLT 265 247 224   BMET Recent Labs    11/08/21 1212 11/09/21 0149 11/10/21 0335  NA 131* 129* 133*  K 2.9* 4.0 4.3  CL 95* 98 100  CO2 20* 20* 26  GLUCOSE 82 85 89  BUN 10 10 10   CREATININE 1.05 1.05 1.11  CALCIUM 8.9 8.5* 8.8*   LFT Recent Labs    11/09/21 0149  PROT 6.3*  ALBUMIN 3.2*  AST 39  ALT 33  ALKPHOS 43  BILITOT 1.7*   PT/INR Recent Labs    11/08/21 1212  LABPROT 13.2  INR 1.0    Studies/Results: CT ABDOMEN PELVIS WO CONTRAST  Result Date: 11/08/2021 CLINICAL DATA:  Acute generalized abdominal pain, nausea, vomiting. EXAM: CT ABDOMEN AND PELVIS WITHOUT CONTRAST TECHNIQUE: Multidetector CT imaging of the abdomen and pelvis was performed following the standard protocol without IV contrast. COMPARISON:  None. FINDINGS: Lower chest: Visualized lung bases are unremarkable. Wall thickening of distal esophagus is noted concerning for esophagitis. Hepatobiliary: Hepatic steatosis is noted. No gallstones, gallbladder  wall thickening, or biliary dilatation. Pancreas: Unremarkable. No pancreatic ductal dilatation or surrounding inflammatory changes. Spleen: Normal in size without focal abnormality. Adrenals/Urinary Tract: Adrenal glands are unremarkable. Kidneys are normal, without renal calculi, focal lesion, or hydronephrosis. Bladder is unremarkable. Stomach/Bowel: Stomach is within normal limits. Appendix appears normal. No evidence of bowel wall thickening, distention, or inflammatory changes. Vascular/Lymphatic: Aortic atherosclerosis. No enlarged abdominal or pelvic lymph nodes. Reproductive: Prostate is unremarkable. Other: No abdominal wall hernia or abnormality. No abdominopelvic ascites. Musculoskeletal: Status post bilateral total hip arthroplasties. No acute abnormality is noted. IMPRESSION: Wall thickening of distal esophagus is noted concerning for esophagitis. Endoscopy may be performed for further evaluation. Hepatic steatosis. Aortic Atherosclerosis (ICD10-I70.0). Electronically Signed   By: Marijo Conception M.D.   On: 11/08/2021 17:35   DG Chest Port 1 View  Result Date: 11/08/2021 CLINICAL DATA:  Chest pain since last night along with SOB. Using bourbon and Viagra. Thinks he took 1 too many pills EXAM: PORTABLE CHEST 1 VIEW COMPARISON:  None. FINDINGS: The heart size and mediastinal contours are within normal limits. No focal consolidation. No visible pleural effusion or pneumothorax. The visualized skeletal structures are unremarkable. IMPRESSION: 1. No acute cardiopulmonary findings. 2. Low lung volumes without evidence of active disease in the chest. Electronically Signed   By: Dahlia Bailiff M.D.   On: 11/08/2021 12:28   ECHOCARDIOGRAM COMPLETE  Result Date:  11/09/2021    ECHOCARDIOGRAM REPORT   Patient Name:   BREVON SIQUEIRA Date of Exam: 11/09/2021 Medical Rec #:  [id number]        Height:       73.0 in Accession #:    [id number]       Weight:       226.7 lb Date of Birth:  February 02, 1956         BSA:           2.269 m Patient Age:    20 years         BP:           111/66 mmHg Patient Gender: M                HR:           87 bpm. Exam Location:  Inpatient Procedure: 2D Echo, Cardiac Doppler and Color Doppler Indications:    Atrial fibrillation  History:        Patient has no prior history of Echocardiogram examinations.                 Risk Factors:Hypertension. GERD. ETOH abuse.  Sonographer:    Clayton Lefort RDCS (AE) Referring Phys: [id number] [place]  1. Left ventricular ejection fraction, by estimation, is 65 to 70%. The left ventricle has normal function. The left ventricle has no regional wall motion abnormalities. There is moderate concentric left ventricular hypertrophy. Left ventricular diastolic parameters are consistent with Grade II diastolic dysfunction (pseudonormalization).  2. Right ventricular systolic function is normal. The right ventricular size is normal. There is normal pulmonary artery systolic pressure.  3. The mitral valve is normal in structure. No evidence of mitral valve regurgitation. No evidence of mitral stenosis.  4. The aortic valve is tricuspid. There is mild calcification of the aortic valve. Aortic valve regurgitation is not visualized. Aortic valve sclerosis is present, with no evidence of aortic valve stenosis. Aortic valve area, by VTI measures 2.88 cm. Aortic valve mean gradient measures 3.0 mmHg. Aortic valve Vmax measures 1.20 m/s.  5. The inferior vena cava is normal in size with greater than 50% respiratory variability, suggesting right atrial pressure of 3 mmHg. FINDINGS  Left Ventricle: Left ventricular ejection fraction, by estimation, is 65 to 70%. The left ventricle has normal function. The left ventricle has no regional wall motion abnormalities. The left ventricular internal cavity size was normal in size. There is  moderate concentric left ventricular hypertrophy. Left ventricular diastolic parameters are consistent with Grade II diastolic  dysfunction (pseudonormalization). Indeterminate filling pressures. Right Ventricle: The right ventricular size is normal. No increase in right ventricular wall thickness. Right ventricular systolic function is normal. There is normal pulmonary artery systolic pressure. The tricuspid regurgitant velocity is 1.52 m/s, and  with an assumed right atrial pressure of 3 mmHg, the estimated right ventricular systolic pressure is 123456 mmHg. Left Atrium: Left atrial size was normal in size. Right Atrium: Right atrial size was normal in size. Pericardium: Trivial pericardial effusion is present. Mitral Valve: The mitral valve is normal in structure. No evidence of mitral valve regurgitation. No evidence of mitral valve stenosis. Tricuspid Valve: The tricuspid valve is normal in structure. Tricuspid valve regurgitation is trivial. No evidence of tricuspid stenosis. Aortic Valve: The aortic valve is tricuspid. There is mild calcification of the aortic valve. Aortic valve regurgitation is not visualized. Aortic valve sclerosis is present, with no evidence of aortic valve stenosis. Aortic valve mean  gradient measures 3.0 mmHg. Aortic valve peak gradient measures 5.8 mmHg. Aortic valve area, by VTI measures 2.88 cm. Pulmonic Valve: The pulmonic valve was normal in structure. Pulmonic valve regurgitation is trivial. No evidence of pulmonic stenosis. Aorta: The aortic root is normal in size and structure. Venous: The inferior vena cava is normal in size with greater than 50% respiratory variability, suggesting right atrial pressure of 3 mmHg. IAS/Shunts: No atrial level shunt detected by color flow Doppler.  LEFT VENTRICLE PLAX 2D LVIDd:         5.00 cm   Diastology LVIDs:         3.10 cm   LV e' medial:    7.18 cm/s LV PW:         1.40 cm   LV E/e' medial:  10.2 LV IVS:        1.30 cm   LV e' lateral:   8.49 cm/s LVOT diam:     2.00 cm   LV E/e' lateral: 8.6 LV SV:         63 LV SV Index:   28 LVOT Area:     3.14 cm  RIGHT  VENTRICLE             IVC RV Basal diam:  2.70 cm     IVC diam: 1.30 cm RV S prime:     13.30 cm/s TAPSE (M-mode): 2.8 cm LEFT ATRIUM             Index        RIGHT ATRIUM           Index LA diam:        4.50 cm 1.98 cm/m   RA Area:     14.80 cm LA Vol (A2C):   51.6 ml 22.74 ml/m  RA Volume:   32.20 ml  14.19 ml/m LA Vol (A4C):   59.2 ml 26.09 ml/m LA Biplane Vol: 60.1 ml 26.48 ml/m  AORTIC VALVE AV Area (Vmax):    2.67 cm AV Area (Vmean):   2.57 cm AV Area (VTI):     2.88 cm AV Vmax:           120.00 cm/s AV Vmean:          87.800 cm/s AV VTI:            0.219 m AV Peak Grad:      5.8 mmHg AV Mean Grad:      3.0 mmHg LVOT Vmax:         102.00 cm/s LVOT Vmean:        71.900 cm/s LVOT VTI:          0.201 m LVOT/AV VTI ratio: 0.92  AORTA Ao Root diam: 3.10 cm Ao Asc diam:  3.50 cm MITRAL VALVE               TRICUSPID VALVE MV Area (PHT): 3.89 cm    TR Peak grad:   9.2 mmHg MV Decel Time: 195 msec    TR Vmax:        152.00 cm/s MV E velocity: 73.30 cm/s MV A velocity: 48.80 cm/s  SHUNTS MV E/A ratio:  1.50        Systemic VTI:  0.20 m                            Systemic Diam: 2.00 cm Skeet Latch MD Electronically signed by Skeet Latch MD Signature Date/Time:  11/09/2021/2:40:37 PM    Final        Assessment / Plan:    65 y/o male with alcohol abuse, fatty liver (no known cirrhosis) who presented with chest pain, SOB, found to be AF with RVR after drinking bourbon and taking Viagras. On heparin drip, converted to sinus rhythm. CT scan done which showed some wall thickening of his esophagus suggesting esophagitis, and fatty liver. He does have mild intermittent dysphagia to solids ongoing for months and some baseline reflux symptoms for which he does not take his protonix routinely.   See note from yesterday for full details of his case. After discussion with cardiology given he converted to sinus rhythm we offered him an EGD with possible dilation while inpatient prior to starting  anticoagulation, and this was scheduled for 845 this AM. Heparin drip held. Patient is declining EGD at this time, states he wants to go home and do it as outpatient. Discussed reasoning for doing it prior to starting anticoagulation, and this would likely not delay his discharge, but regardless he declines, wants to do as outpatient.   In this light, continue protonix 40mg  BID for now. I will coordinate an office visit with me in upcoming weeks and can do EGD as outpatient in next 1-2 months. He will need to hold anticoagulation a few days when we do this, if cleared by cardiology, in case dilation is performed. He is requesting to eat a diet which is okay with me. Sounds like anticoagulation to be started today per cardiology.   Plan: - patient declining EGD today as above - okay to resume diet - protonix 40mg  BID now and upon discharge - I will coordinate office follow up with me in upcoming weeks and to do EGD as outpatient. Patient would need to hold anticoagulation for a few days prior to the exam if he remains on anticoagulation - recommend alcohol abstinence, we discussed this  Call with questions, will sign off for now.  , MD [facility]

## 2021-11-10 NOTE — Telephone Encounter (Signed)
-----   Message from Benancio Deeds, MD sent at 11/10/2021  7:57 AM EST ----- Regarding: outpatient follow up [facility], This patient is being discharged today. Will need outpatient follow up with me or APP within a month or so if you can help coordinate. Thanks

## 2021-11-10 NOTE — Progress Notes (Signed)
Subjective:  Denies SSCP, palpitations or Dyspnea EGD this am canceled patient prefers outpatient f/u   Objective:  Vitals:   11/10/21 0303 11/10/21 0305 11/10/21 0615 11/10/21 0618  BP: 137/78  128/82 (!) 126/91  Pulse:   82 87  Resp:   18   Temp:   98.6 F (37 C)   TempSrc:   Oral   SpO2:  97% 96% 94%  Weight:      Height:        Intake/Output from previous day:  Intake/Output Summary (Last 24 hours) at 11/10/2021 [id number] Last data filed at 11/10/2021 0700 Gross per 24 hour  Intake 796.9 ml  Output 2350 ml  Net -1553.1 ml    Physical Exam: Affect appropriate Healthy:  appears stated age HEENT: normal Neck supple with no adenopathy JVP normal no bruits no thyromegaly Lungs clear with no wheezing and good diaphragmatic motion Heart:  S1/S2 no murmur, no rub, gallop or click PMI normal Abdomen: benighn, BS positve, no tenderness, no AAA no bruit.  No HSM or HJR Distal pulses intact with no bruits No edema Neuro non-focal Skin warm and dry No muscular weakness   Lab Results: Basic Metabolic Panel: Recent Labs    11/09/21 0149 11/10/21 0335  NA 129* 133*  K 4.0 4.3  CL 98 100  CO2 20* 26  GLUCOSE 85 89  BUN 10 10  CREATININE 1.05 1.11  CALCIUM 8.5* 8.8*  MG 1.5* 2.3   Liver Function Tests: Recent Labs    11/08/21 1212 11/09/21 0149  AST 58* 39  ALT 43 33  ALKPHOS 55 43  BILITOT 1.6* 1.7*  PROT 7.1 6.3*  ALBUMIN 3.8 3.2*   Recent Labs    11/08/21 1520  LIPASE 51   CBC: Recent Labs    11/08/21 1212 11/09/21 0149 11/10/21 0335  WBC 7.7 7.6 5.6  NEUTROABS 6.1  --   --   HGB 15.4 14.2 14.1  HCT 44.1 41.7 40.7  MCV 93.6 95.6 95.1  PLT 265 247 224    Thyroid Function Tests: Recent Labs    11/08/21 1520  TSH 5.297*   Anemia Panel: No results for input(s): VITAMINB12, FOLATE, FERRITIN, TIBC, IRON, RETICCTPCT in the last 72 hours.  Imaging: CT ABDOMEN PELVIS WO CONTRAST  Result Date: 11/08/2021 CLINICAL DATA:  Acute  generalized abdominal pain, nausea, vomiting. EXAM: CT ABDOMEN AND PELVIS WITHOUT CONTRAST TECHNIQUE: Multidetector CT imaging of the abdomen and pelvis was performed following the standard protocol without IV contrast. COMPARISON:  None. FINDINGS: Lower chest: Visualized lung bases are unremarkable. Wall thickening of distal esophagus is noted concerning for esophagitis. Hepatobiliary: Hepatic steatosis is noted. No gallstones, gallbladder wall thickening, or biliary dilatation. Pancreas: Unremarkable. No pancreatic ductal dilatation or surrounding inflammatory changes. Spleen: Normal in size without focal abnormality. Adrenals/Urinary Tract: Adrenal glands are unremarkable. Kidneys are normal, without renal calculi, focal lesion, or hydronephrosis. Bladder is unremarkable. Stomach/Bowel: Stomach is within normal limits. Appendix appears normal. No evidence of bowel wall thickening, distention, or inflammatory changes. Vascular/Lymphatic: Aortic atherosclerosis. No enlarged abdominal or pelvic lymph nodes. Reproductive: Prostate is unremarkable. Other: No abdominal wall hernia or abnormality. No abdominopelvic ascites. Musculoskeletal: Status post bilateral total hip arthroplasties. No acute abnormality is noted. IMPRESSION: Wall thickening of distal esophagus is noted concerning for esophagitis. Endoscopy may be performed for further evaluation. Hepatic steatosis. Aortic Atherosclerosis (ICD10-I70.0). Electronically Signed   By: Lupita RaiderJames  Green Jr M.D.   On: 11/08/2021 17:35   DG Chest Port 1  View  Result Date: 11/08/2021 CLINICAL DATA:  Chest pain since last night along with SOB. Using bourbon and Viagra. Thinks he took 1 too many pills EXAM: PORTABLE CHEST 1 VIEW COMPARISON:  None. FINDINGS: The heart size and mediastinal contours are within normal limits. No focal consolidation. No visible pleural effusion or pneumothorax. The visualized skeletal structures are unremarkable. IMPRESSION: 1. No acute  cardiopulmonary findings. 2. Low lung volumes without evidence of active disease in the chest. Electronically Signed   By: Dahlia Bailiff M.D.   On: 11/08/2021 12:28   ECHOCARDIOGRAM COMPLETE  Result Date: 11/09/2021    ECHOCARDIOGRAM REPORT   Patient Name:   Jeffrey Farrell Date of Exam: 11/09/2021 Medical Rec #:  [id number]        Height:       73.0 in Accession #:    [id number]       Weight:       226.7 lb Date of Birth:  October 24, 1956         BSA:          2.269 m Patient Age:    65 years         BP:           111/66 mmHg Patient Gender: M                HR:           87 bpm. Exam Location:  Inpatient Procedure: 2D Echo, Cardiac Doppler and Color Doppler Indications:    Atrial fibrillation  History:        Patient has no prior history of Echocardiogram examinations.                 Risk Factors:Hypertension. GERD. ETOH abuse.  Sonographer:    Clayton Lefort RDCS (AE) Referring Phys: [id number] [employer]  1. Left ventricular ejection fraction, by estimation, is 65 to 70%. The left ventricle has normal function. The left ventricle has no regional wall motion abnormalities. There is moderate concentric left ventricular hypertrophy. Left ventricular diastolic parameters are consistent with Grade II diastolic dysfunction (pseudonormalization).  2. Right ventricular systolic function is normal. The right ventricular size is normal. There is normal pulmonary artery systolic pressure.  3. The mitral valve is normal in structure. No evidence of mitral valve regurgitation. No evidence of mitral stenosis.  4. The aortic valve is tricuspid. There is mild calcification of the aortic valve. Aortic valve regurgitation is not visualized. Aortic valve sclerosis is present, with no evidence of aortic valve stenosis. Aortic valve area, by VTI measures 2.88 cm. Aortic valve mean gradient measures 3.0 mmHg. Aortic valve Vmax measures 1.20 m/s.  5. The inferior vena cava is normal in size with greater than 50%  respiratory variability, suggesting right atrial pressure of 3 mmHg. FINDINGS  Left Ventricle: Left ventricular ejection fraction, by estimation, is 65 to 70%. The left ventricle has normal function. The left ventricle has no regional wall motion abnormalities. The left ventricular internal cavity size was normal in size. There is  moderate concentric left ventricular hypertrophy. Left ventricular diastolic parameters are consistent with Grade II diastolic dysfunction (pseudonormalization). Indeterminate filling pressures. Right Ventricle: The right ventricular size is normal. No increase in right ventricular wall thickness. Right ventricular systolic function is normal. There is normal pulmonary artery systolic pressure. The tricuspid regurgitant velocity is 1.52 m/s, and  with an assumed right atrial pressure of 3 mmHg, the estimated right ventricular systolic pressure is 123456 mmHg.  Left Atrium: Left atrial size was normal in size. Right Atrium: Right atrial size was normal in size. Pericardium: Trivial pericardial effusion is present. Mitral Valve: The mitral valve is normal in structure. No evidence of mitral valve regurgitation. No evidence of mitral valve stenosis. Tricuspid Valve: The tricuspid valve is normal in structure. Tricuspid valve regurgitation is trivial. No evidence of tricuspid stenosis. Aortic Valve: The aortic valve is tricuspid. There is mild calcification of the aortic valve. Aortic valve regurgitation is not visualized. Aortic valve sclerosis is present, with no evidence of aortic valve stenosis. Aortic valve mean gradient measures 3.0 mmHg. Aortic valve peak gradient measures 5.8 mmHg. Aortic valve area, by VTI measures 2.88 cm. Pulmonic Valve: The pulmonic valve was normal in structure. Pulmonic valve regurgitation is trivial. No evidence of pulmonic stenosis. Aorta: The aortic root is normal in size and structure. Venous: The inferior vena cava is normal in size with greater than 50%  respiratory variability, suggesting right atrial pressure of 3 mmHg. IAS/Shunts: No atrial level shunt detected by color flow Doppler.  LEFT VENTRICLE PLAX 2D LVIDd:         5.00 cm   Diastology LVIDs:         3.10 cm   LV e' medial:    7.18 cm/s LV PW:         1.40 cm   LV E/e' medial:  10.2 LV IVS:        1.30 cm   LV e' lateral:   8.49 cm/s LVOT diam:     2.00 cm   LV E/e' lateral: 8.6 LV SV:         63 LV SV Index:   28 LVOT Area:     3.14 cm  RIGHT VENTRICLE             IVC RV Basal diam:  2.70 cm     IVC diam: 1.30 cm RV S prime:     13.30 cm/s TAPSE (M-mode): 2.8 cm LEFT ATRIUM             Index        RIGHT ATRIUM           Index LA diam:        4.50 cm 1.98 cm/m   RA Area:     14.80 cm LA Vol (A2C):   51.6 ml 22.74 ml/m  RA Volume:   32.20 ml  14.19 ml/m LA Vol (A4C):   59.2 ml 26.09 ml/m LA Biplane Vol: 60.1 ml 26.48 ml/m  AORTIC VALVE AV Area (Vmax):    2.67 cm AV Area (Vmean):   2.57 cm AV Area (VTI):     2.88 cm AV Vmax:           120.00 cm/s AV Vmean:          87.800 cm/s AV VTI:            0.219 m AV Peak Grad:      5.8 mmHg AV Mean Grad:      3.0 mmHg LVOT Vmax:         102.00 cm/s LVOT Vmean:        71.900 cm/s LVOT VTI:          0.201 m LVOT/AV VTI ratio: 0.92  AORTA Ao Root diam: 3.10 cm Ao Asc diam:  3.50 cm MITRAL VALVE               TRICUSPID VALVE MV Area (PHT): 3.89 cm  TR Peak grad:   9.2 mmHg MV Decel Time: 195 msec    TR Vmax:        152.00 cm/s MV E velocity: 73.30 cm/s MV A velocity: 48.80 cm/s  SHUNTS MV E/A ratio:  1.50        Systemic VTI:  0.20 m                            Systemic Diam: 2.00 cm Skeet Latch MD Electronically signed by Skeet Latch MD Signature Date/Time: 11/09/2021/2:40:37 PM    Final     Cardiac Studies:  ECG:    Telemetry:  NSR rate 80's   Echo: EF 65-70% moderate LAE AV sclerosis   Medications:    folic acid  1 mg Oral Daily   levothyroxine  150 mcg Oral QAC breakfast   metoprolol tartrate  25 mg Oral BID   multivitamin with  minerals  1 tablet Oral Daily   pantoprazole  40 mg Oral BID   tamsulosin  0.4 mg Oral Daily   thiamine  100 mg Oral Daily   Or   thiamine  100 mg Intravenous Daily      heparin      Assessment/Plan:   PAF:  CHADVASC 1 with HTN will be 3 on next birthday Recent afib with conversion favor 4 weeks of anticoagulation with eliquis and 30 day event monitor as outpatient Will arrange outpatient f/u in afib clinic and monitor D/c with eliquis 5 mg bid  GI:  ETOH abuse with abnormal esophageal findings on CT and dysphagia Defers EGD outpatient f/u with Dr Havery Moros continue [place] to d/c home   Jenkins Rouge 11/10/2021, 9:03 AM

## 2021-11-10 NOTE — Progress Notes (Signed)
Mobility Specialist Progress Note    11/10/21 1143  Mobility  Activity Ambulated in hall  Level of Assistance Independent  Assistive Device None  Distance Ambulated (ft) 330 ft  Mobility Ambulated independently in hallway  Mobility Response Tolerated well  Mobility performed by Mobility specialist  Bed Position Chair  $Mobility charge 1 Mobility   Pt received in chair and agreeable. No complaints on walk. Returned to chair with call bell in reach.   [facility] Mobility Specialist  M.S. Primary Phone: [phone] M.S. Secondary Phone: [phone]

## 2021-11-10 NOTE — Telephone Encounter (Signed)
Patient has follow-up with A. FIB clinic 12/05/20. Will place order for monitor.

## 2021-11-10 NOTE — Discharge Instructions (Addendum)
You were hospitalized due to an abnormal heart rhythm (atrial fibrillation) that is likely related to your alcohol use. You were seen by cardiology and were started on metoprolol (a beta blocker to control your heart rate) and Eliquis (a blood thinner), which you will need to continue and follow-up with cardiology. They will also likely want to place a monitor to check your heart rhythm for 30 days.  On CT can, you were shown to have irritation/inflammation of your esophagus. You were started on a medication called Protonix, which you will take twice daily. You will also need to follow-up with GI outpatient for further evaluation.   We always recommend decreasing your alcohol intake and consider resources such as AA. It will also be important to start working to decrease your Temazepam as this medication has an addiction potential and other side effects we would like to avoid with prolonged use.   It will be VERY IMPORTANT to take your medications as prescribed and to please follow-up with your Primary Care Provider.  Information on my medicine - ELIQUIS (apixaban)  This medication education was reviewed with me or my healthcare representative as part of my discharge preparation.   Why was Eliquis prescribed for you? Eliquis was prescribed for you to reduce the risk of a blood clot forming that can cause a stroke if you have a medical condition called atrial fibrillation (a type of irregular heartbeat).  What do You need to know about Eliquis ? Take your Eliquis TWICE DAILY - one tablet in the morning and one tablet in the evening with or without food. If you have difficulty swallowing the tablet whole please discuss with your pharmacist how to take the medication safely.  Take Eliquis exactly as prescribed by your doctor and DO NOT stop taking Eliquis without talking to the doctor who prescribed the medication.  Stopping may increase your risk of developing a stroke.  Refill your  prescription before you run out.  After discharge, you should have regular check-up appointments with your healthcare provider that is prescribing your Eliquis.  In the future your dose may need to be changed if your kidney function or weight changes by a significant amount or as you get older.  What do you do if you miss a dose? If you miss a dose, take it as soon as you remember on the same day and resume taking twice daily.  Do not take more than one dose of ELIQUIS at the same time to make up a missed dose.  Important Safety Information A possible side effect of Eliquis is bleeding. You should call your healthcare provider right away if you experience any of the following: Bleeding from an injury or your nose that does not stop. Unusual colored urine (red or dark brown) or unusual colored stools (red or black). Unusual bruising for unknown reasons. A serious fall or if you hit your head (even if there is no bleeding).  Some medicines may interact with Eliquis and might increase your risk of bleeding or clotting while on Eliquis. To help avoid this, consult your healthcare provider or pharmacist prior to using any new prescription or non-prescription medications, including herbals, vitamins, non-steroidal anti-inflammatory drugs (NSAIDs) and supplements.  This website has more information on Eliquis (apixaban): http://www.eliquis.com/eliquis/home

## 2021-11-10 NOTE — Telephone Encounter (Signed)
Attempted to reach patient twice. His phone goes straight to vm. Lm on vm for patient to call back to set up a follow up appt in 1 month.

## 2021-11-10 NOTE — Plan of Care (Signed)
°  Problem: Clinical Measurements: Goal: Ability to maintain clinical measurements within normal limits will improve Outcome: Progressing Goal: Will remain free from infection Outcome: Progressing Goal: Diagnostic test results will improve Outcome: Progressing Goal: Cardiovascular complication will be avoided Outcome: Progressing   Problem: Activity: Goal: Risk for activity intolerance will decrease Outcome: Progressing   Problem: Nutrition: Goal: Adequate nutrition will be maintained Outcome: Progressing   Problem: Coping: Goal: Level of anxiety will decrease Outcome: Progressing   Problem: Pain Managment: Goal: General experience of comfort will improve Outcome: Progressing   Problem: Safety: Goal: Ability to remain free from injury will improve Outcome: Progressing   Problem: Skin Integrity: Goal: Risk for impaired skin integrity will decrease Outcome: Progressing

## 2021-11-10 NOTE — Care Management (Signed)
1301 11-10-21 Patient unwilling to wait for [facility] transport to be arranged by the unit secretary. Patient wanted to take the bus home to [employer]. Case Manager provided the patient 2 bus tickets for transport. No further needs from Case Manager at this time.

## 2021-11-10 NOTE — Discharge Summary (Addendum)
Family Medicine Teaching [facility] Discharge Summary  Patient name: Jeffrey Farrell Medical record number: [id number] Date of birth: 1956-10-28 Age: 64 y.o. Gender: male Date of Admission: 11/08/2021  Date of Discharge: 11/10/2021 Admitting Physician: Westley Chandler, MD  Primary Care Provider: April Manson, NP Consultants: GI, cardiology  Indication for Hospitalization: Atrial fibrillation with RVR  Discharge Diagnoses/Problem List:  Principal Problem:   Atrial fibrillation with RVR (HCC) Active Problems:   Alcohol use, unspecified with other alcohol-induced disorder (HCC)   Abdominal pain   Dysphagia   Gastroesophageal reflux disease   Abnormal finding on GI tract imaging    Disposition: Home  Discharge Condition: Stable  Discharge Exam:  Blood pressure 118/67, pulse 97, temperature 98.6 F (37 C), temperature source Oral, resp. rate 18, height 6\' 1"  (1.854 m), weight 102.8 kg, SpO2 94 %.  General: Awake and alert, NAD Cardiovascular: RRR, no murmurs auscultated Pulmonary: CTA B, no increased work of breathing Gastrointestinal: Soft, nontender, obese abdomen, normoactive bowel sounds Extremities: RUE without tenderness, Tinel's sign negative, ROM normal Psych: Normal behavior and mood  Brief Hospital Course:  Jeffrey Farrell is a 65 y.o.male with a history of hypothyroidism, HTN, GERD, GAD, BPH, insomnia, alcohol abuse disorder and medication nonadherence who was admitted to the [facility] Teaching Service at [facility] for shortness of breath and chest pain. His hospital course is detailed below:  New onset A. fib with RVR Presents via EMS in A. fib with a rate of 180-200s.  This took place after significant alcohol intake since the day prior. Placed on diltiazem drip for rhythm control and then transition to p.o. Metoprolol. Cardiology was consulted. He converted back to NSR during his stay. Echo showed LVEF 65-70%, G2DD, no wall motion  abnormalities.  Cardiology recommended eliquis 5mg  BID post d/c and 30 day event monitor for PAF. He is to follow up outpatient in Afib clinic.  Esophagitis Patient experienced a couple emesis episodes during admission.  CT abdomen/pelvis revealed esophagitis.  GI recommended EGD, which the pt opted for EGD in the outpatient setting. He was discharged with instructions to take Protonix 40mg  BID and to follow up with GI in upcoming weeks.   Alcohol Withdrawal  Continued with CIWA scoring and Ativan per CIWA protocol for alcohol withdrawal. Tolerated this well. Follow up outpatient for alcohol use. Alcohol cessation counseling and resources provided.  Right arm pain Pt noted to have right arm pain that is exacerbated when he is lying on it. Did not have any point tenderness or Tinel's sign and all ROM is normal. Improved with tylenol. Would benefit from outpatient workup. Advise neck and shoulder XR as part of workup.  Other chronic conditions were medically managed with home medications and formulary alternatives as necessary (BPH)  PCP Follow-up Recommendations: Consider repeat TSH testing outpatient. Elevated in the hospital to 5.297 Discussion about alcohol cessation  Follow up for esophagitis, needs EGD, also never had colonoscopy Follow up for AFib Right arm pain outpatient workup (see above for recs), consider neurology if necessary Advise weaning off benzodiazepine use Consider HTN reevaluation. Normotensive without requiring medicaitons in inpatient setting.  Significant Procedures: None  Significant Labs and Imaging:  Recent Labs  Lab 11/08/21 1212 11/09/21 0149 11/10/21 0335  WBC 7.7 7.6 5.6  HGB 15.4 14.2 14.1  HCT 44.1 41.7 40.7  PLT 265 247 224   Recent Labs  Lab 11/08/21 1212 11/08/21 1520 11/09/21 0149 11/10/21 0335  NA 131*  --  129* 133*  K  2.9*  --  4.0 4.3  CL 95*  --  98 100  CO2 20*  --  20* 26  GLUCOSE 82  --  85 89  BUN 10  --  10 10  CREATININE  1.05  --  1.05 1.11  CALCIUM 8.9  --  8.5* 8.8*  MG  --  1.7 1.5* 2.3  ALKPHOS 55  --  43  --   AST 58*  --  39  --   ALT 43  --  33  --   ALBUMIN 3.8  --  3.2*  --    Results/Tests Pending at Time of Discharge: None  Discharge Medications:  Allergies as of 11/10/2021   No Known Allergies      Medication List     STOP taking these medications    amLODipine 5 MG tablet Commonly known as: NORVASC   chlordiazePOXIDE 25 MG capsule Commonly known as: LIBRIUM   clonazePAM 0.5 MG tablet Commonly known as: KLONOPIN   hydrochlorothiazide 25 MG tablet Commonly known as: HYDRODIURIL   sildenafil 100 MG tablet Commonly known as: VIAGRA       TAKE these medications    albuterol 108 (90 Base) MCG/ACT inhaler Commonly known as: VENTOLIN HFA Inhale 1-2 puffs into the lungs every 4 (four) hours as needed for wheezing.   apixaban 5 MG Tabs tablet Commonly known as: ELIQUIS Take 1 tablet (5 mg total) by mouth 2 (two) times daily.   folic acid 1 MG tablet Commonly known as: FOLVITE Take 1 tablet (1 mg total) by mouth daily.   levothyroxine 150 MCG tablet Commonly known as: SYNTHROID Take 1 tablet (150 mcg total) by mouth daily.   metoprolol tartrate 25 MG tablet Commonly known as: LOPRESSOR Take 1 tablet (25 mg total) by mouth 2 (two) times daily.   multivitamin with minerals Tabs tablet Take 1 tablet by mouth daily.   pantoprazole 40 MG tablet Commonly known as: PROTONIX Take 1 tablet (40 mg total) by mouth 2 (two) times daily. What changed:  when to take this reasons to take this   tamsulosin 0.4 MG Caps capsule Commonly known as: FLOMAX Take 1 capsule (0.4 mg total) by mouth daily.   temazepam 30 MG capsule Commonly known as: RESTORIL Take 30 mg by mouth at bedtime.   thiamine 100 MG tablet Take 1 tablet (100 mg total) by mouth daily.        Discharge Instructions: Please refer to Patient Instructions section of EMR for full details.  Patient was  counseled important signs and symptoms that should prompt return to medical care, changes in medications, dietary instructions, activity restrictions, and follow up appointments.   Follow-Up Appointments: Future Appointments  Date Time Provider [place]  12/05/2021  2:00 PM [place], [place], [place] [facility] None    Wells Guiles, [place] 11/10/2021, 11:13 AM PGY-1, [place]

## 2021-11-10 NOTE — Telephone Encounter (Signed)
-----   Message from Wendall Stade, MD sent at 11/10/2021  9:07 AM EST ----- Needs 30 day event monitor for PAF and outpatient f/u 4 weeks for PAF  F/U with me or afib cllinic

## 2021-11-10 NOTE — Progress Notes (Signed)
FPTS Brief Progress Note  S: Patient seen at bedside for evening rounds and was sleeping soundly. Spoke with primary RN, Dell Ponto, who has no specific concerns at this time. She reports his withdrawal symptoms are much improved after receiving several doses of Ativan earlier in the shift. She encouraged patient to re-consider EGD (he declines but will think about it).  O: BP 132/75    Pulse 77    Temp 98.2 F (36.8 C) (Oral)    Resp 18    Ht 6\' 1"  (1.854 m)    Wt 102.8 kg    SpO2 95%    BMI 29.91 kg/m     A/P: Alcohol Use Disorder, Alcohol Withdrawal CIWA 15>15>0>0, has received 6mg  Ativan so far this shift (7mg  total over past 24h). Continue CIWA monitoring.  Esophagitis Possible EGD later this morning, however patient refusing at the present time.  A-Fib w/RVR-- resolved Patient remains in NSR with HR in the 90s. Cardiology following.  Remainder per day team progress note.  - Orders reviewed. Labs for AM ordered, which was adjusted as needed.    , MD 11/10/2021, 2:00 AM PGY-2, [facility] Night Resident  Please page [phone] with questions.

## 2021-11-13 NOTE — Telephone Encounter (Signed)
Lm on vm for patient to return call to schedule his 33-month hospital follow up appt with Dr. Adela Lank.

## 2021-11-15 NOTE — Telephone Encounter (Signed)
Called and spoke with patient. He has been scheduled for a hospital follow up with Dr. Adela Lank on Friday, 12/29/21 at 2:10 pm. Pt states that he will have Friday health insurance at that time, [facility] states that we do accept that insurance. Pt is aware that he will need to bring a copy of his insurance card to his appt. Pt is aware that I will mail and send his appt information to his my chart. Pt verbalized understanding and had no concerns at the end of the call.

## 2021-11-21 NOTE — Telephone Encounter (Signed)
Called patient about his message. Patient was concerned because his HR was dropping into the 50's at times. Informed patient that this is fine as long as he is not having any symptoms. Patient stated he does feel tired at times, but he is doing fine. Encouraged patient to call if he starts to have dizziness or feels faint. Patient has not received his monitor yet, but he will put on when he gets it.  Patient was wondering about taking his other medications. Informed patient to discuss with his PCP this week. Patient stated he is not sure if he will be able to afford eliquis. Advised patient to talk to his insurance company about cost. Informed patient that he can also fill out patient assistance forms as well. Patient verbalized understanding.

## 2021-11-23 ENCOUNTER — Ambulatory Visit (INDEPENDENT_AMBULATORY_CARE_PROVIDER_SITE_OTHER): Payer: BC Managed Care – PPO

## 2021-11-23 ENCOUNTER — Ambulatory Visit (HOSPITAL_COMMUNITY): Payer: BC Managed Care – PPO | Admitting: Nurse Practitioner

## 2021-11-23 DIAGNOSIS — I480 Paroxysmal atrial fibrillation: Secondary | ICD-10-CM

## 2021-12-05 ENCOUNTER — Ambulatory Visit (HOSPITAL_COMMUNITY): Payer: BC Managed Care – PPO | Admitting: Physician Assistant

## 2021-12-08 ENCOUNTER — Other Ambulatory Visit: Payer: Self-pay

## 2021-12-08 ENCOUNTER — Other Ambulatory Visit: Payer: Self-pay | Admitting: Family Medicine

## 2021-12-08 ENCOUNTER — Ambulatory Visit (HOSPITAL_COMMUNITY)
Admission: RE | Admit: 2021-12-08 | Discharge: 2021-12-08 | Disposition: A | Discharge status: Home / Self Care | Payer: 59 | Source: Ambulatory Visit | Attending: Nurse Practitioner | Admitting: Nurse Practitioner

## 2021-12-08 VITALS — BP 118/70 | HR 52 | Ht 73.0 in | Wt 218.4 lb

## 2021-12-08 DIAGNOSIS — K760 Fatty (change of) liver, not elsewhere classified: Secondary | ICD-10-CM | POA: Diagnosis not present

## 2021-12-08 DIAGNOSIS — I480 Paroxysmal atrial fibrillation: Secondary | ICD-10-CM | POA: Insufficient documentation

## 2021-12-08 DIAGNOSIS — F1010 Alcohol abuse, uncomplicated: Secondary | ICD-10-CM | POA: Diagnosis not present

## 2021-12-08 DIAGNOSIS — R001 Bradycardia, unspecified: Secondary | ICD-10-CM | POA: Diagnosis not present

## 2021-12-08 DIAGNOSIS — Z79899 Other long term (current) drug therapy: Secondary | ICD-10-CM | POA: Diagnosis not present

## 2021-12-08 DIAGNOSIS — R1310 Dysphagia, unspecified: Secondary | ICD-10-CM | POA: Diagnosis not present

## 2021-12-08 DIAGNOSIS — I10 Essential (primary) hypertension: Secondary | ICD-10-CM | POA: Insufficient documentation

## 2021-12-08 DIAGNOSIS — Z7901 Long term (current) use of anticoagulants: Secondary | ICD-10-CM | POA: Diagnosis not present

## 2021-12-08 DIAGNOSIS — R5383 Other fatigue: Secondary | ICD-10-CM | POA: Insufficient documentation

## 2021-12-08 MED ORDER — METOPROLOL TARTRATE 25 MG PO TABS: ORAL_TABLET | ORAL | 2 refills | Status: DC

## 2021-12-08 NOTE — Patient Instructions (Signed)
Take 1/2 tablet in the morning and 1 tablet in the evening  Follow up with Dr. Johnsie Cancel in 3 months

## 2021-12-08 NOTE — Progress Notes (Signed)
Primary Care Physician: April Manson, NP Primary Cardiologist: Dr [place] Emms Primary Electrophysiologist: none Referring Physician: Dr Michail Jewels is a 66 y.o. male with a history of HTN, alcohol abuse, fatty liver, atrial fibrillation who presents for consultation in the [facility]. The patient was initially diagnosed with atrial fibrillation 10/2021 after presenting to the ED with symptoms of chest pain and SOB after heavy alcohol use and beyond prescribed amounts of Viagra. He spontaneously converted at the hospital. Patient is on Eliquis for a CHADS2VASC score of 1. He was given a 30 day event monitor to wear as an outpatient. He is in SB today with some mild symptoms of fatigue. He was only able to wear the event monitor for ~ 2 weeks due to skin irritation. He has not any any alcohol to drink since discharge.   Today, he denies symptoms of palpitations, chest pain, shortness of breath, orthopnea, PND, lower extremity edema, dizziness, presyncope, syncope, snoring, daytime somnolence, bleeding, or neurologic sequela. The patient is tolerating medications without difficulties and is otherwise without complaint today.    Atrial Fibrillation Risk Factors:  he does not have symptoms or diagnosis of sleep apnea. he does not have a history of rheumatic fever. he does have a history of alcohol use. The patient does not have a history of early familial atrial fibrillation or other arrhythmias.  he has a BMI of Body mass index is 28.81 kg/m.Marland Kitchen Filed Weights   12/08/21 1115  Weight: 99.1 kg    No family history on file.   Atrial Fibrillation Management history:  Previous antiarrhythmic drugs: none Previous cardioversions: none Previous ablations: none CHADS2VASC score: 1 Anticoagulation history: Eliquis   Past Medical History:  Diagnosis Date   Asthma    Hypertension    Thyroid disease    Past Surgical History:  Procedure Laterality  Date   TONSILLECTOMY      Current Outpatient Medications  Medication Sig Dispense Refill   albuterol (VENTOLIN HFA) 108 (90 Base) MCG/ACT inhaler Inhale 1-2 puffs into the lungs every 4 (four) hours as needed for wheezing.     apixaban (ELIQUIS) 5 MG TABS tablet Take 1 tablet (5 mg total) by mouth 2 (two) times daily. 60 tablet 0   folic acid (FOLVITE) 1 MG tablet Take 1 tablet (1 mg total) by mouth daily. 30 tablet 0   levothyroxine (SYNTHROID) 150 MCG tablet Take 1 tablet (150 mcg total) by mouth daily. 30 tablet 0   metoprolol tartrate (LOPRESSOR) 25 MG tablet Take 1 tablet (25 mg total) by mouth 2 (two) times daily. 60 tablet 0   pantoprazole (PROTONIX) 40 MG tablet Take 1 tablet (40 mg total) by mouth 2 (two) times daily. 60 tablet 0   tamsulosin (FLOMAX) 0.4 MG CAPS capsule Take 1 capsule (0.4 mg total) by mouth daily. 30 capsule 0   temazepam (RESTORIL) 30 MG capsule Take 30 mg by mouth at bedtime.     thiamine 100 MG tablet Take 1 tablet (100 mg total) by mouth daily. 30 tablet 0   triamcinolone cream (KENALOG) 0.1 % Apply 1 application topically as needed.     fluticasone (FLONASE) 50 MCG/ACT nasal spray Place 1 spray into both nostrils daily. (Patient not taking: Reported on 12/08/2021)     No current facility-administered medications for this encounter.    No Known Allergies  Social History   Socioeconomic History   Marital status: Significant Other    Spouse name: Not  on file   Number of children: Not on file   Years of education: Not on file   Highest education level: Not on file  Occupational History   Not on file  Tobacco Use   Smoking status: Former    Types: Cigarettes   Smokeless tobacco: Never  Substance and Sexual Activity   Alcohol use: Yes   Drug use: Yes   Sexual activity: Yes  Other Topics Concern   Not on file  Social History Narrative   Not on file   Social Determinants of Health   Financial Resource Strain: Not on file  Food Insecurity: Not  on file  Transportation Needs: Not on file  Physical Activity: Not on file  Stress: Not on file  Social Connections: Not on file  Intimate Partner Violence: Not on file     ROS- All systems are reviewed and negative except as per the HPI above.  Physical Exam: Vitals:   12/08/21 1115  BP: 118/70  Pulse: (!) 52  Weight: 99.1 kg  Height: 6\' 1"  (1.854 m)    GEN- The patient is a well appearing male, alert and oriented x 3 today.   Head- normocephalic, atraumatic Eyes-  Sclera clear, conjunctiva pink Ears- hearing intact Oropharynx- clear Neck- supple  Lungs- Clear to ausculation bilaterally, normal work of breathing Heart- Regular rate and rhythm, bradycardia, no murmurs, rubs or gallops  GI- soft, NT, ND, + BS Extremities- no clubbing, cyanosis, or edema MS- no significant deformity or atrophy Skin- no rash or lesion Psych- euthymic mood, full affect Neuro- strength and sensation are intact  Wt Readings from Last 3 Encounters:  12/08/21 99.1 kg  11/09/21 102.8 kg    EKG today demonstrates  SB Vent. rate 52 BPM PR interval 198 ms QRS duration 88 ms QT/QTcB 414/385 ms  Echo 11/09/21 demonstrated  1. Left ventricular ejection fraction, by estimation, is 65 to 70%. The  left ventricle has normal function. The left ventricle has no regional  wall motion abnormalities. There is moderate concentric left ventricular  hypertrophy. Left ventricular diastolic parameters are consistent with Grade II diastolic dysfunction (pseudonormalization).   2. Right ventricular systolic function is normal. The right ventricular  size is normal. There is normal pulmonary artery systolic pressure.   3. The mitral valve is normal in structure. No evidence of mitral valve  regurgitation. No evidence of mitral stenosis.   4. The aortic valve is tricuspid. There is mild calcification of the  aortic valve. Aortic valve regurgitation is not visualized. Aortic valve  sclerosis is present, with  no evidence of aortic valve stenosis. Aortic  valve area, by VTI measures 2.88 cm. Aortic valve mean gradient measures 3.0 mmHg. Aortic valve Vmax measures 1.20 m/s.   5. The inferior vena cava is normal in size with greater than 50%  respiratory variability, suggesting right atrial pressure of 3 mmHg.   Epic records are reviewed at length today  CHA2DS2-VASc Score = 1  The patient's score is based upon: CHF History: 0 HTN History: 1 Diabetes History: 0 Stroke History: 0 Vascular Disease History: 0 Age Score: 0 Gender Score: 0       ASSESSMENT AND PLAN: 1. Paroxysmal Atrial Fibrillation (ICD10:  I48.0) The patient's CHA2DS2-VASc score is 1, indicating a 0.6% annual risk of stroke.   General education about afib provided and questions answered. We also discussed his stroke risk and the risks and benefits of anticoagulation. Afib likely related to previous heavy alcohol use. He remains in  SR and is now 4 weeks post spontaneous conversion. We discussed stopping anticoagulation. He would prefer to continue on Eliquis 5 mg BID for now given his CV score will be 2 in April.  Decrease Lopressor to 12.5 mg AM and 25 mg PM given bradycardia and fatigue.  Patient doing well with alcohol cessation.    2. HTN Stable, med changes as above.   3. Dysphagia  Follow up with Dr Adela Lank scheduled.    Follow up with Dr Eden Emms in 3 months.    Jorja Loa PA-C [facility] [address] [place], [place] [address] [phone] 12/08/2021 11:48 AM

## 2021-12-11 ENCOUNTER — Telehealth: Payer: Self-pay | Admitting: Cardiovascular Disease

## 2021-12-11 ENCOUNTER — Ambulatory Visit (HOSPITAL_COMMUNITY): Payer: Self-pay | Admitting: Physician Assistant

## 2021-12-11 NOTE — Telephone Encounter (Signed)
Patient just saw AFIB Clinic 12/08/21 and was in NS at that appointment. Here is the note from [facility] PA. -    ASSESSMENT AND PLAN: 1. Paroxysmal Atrial Fibrillation (ICD10:  I48.0) The patient's CHA2DS2-VASc score is 1, indicating a 0.6% annual risk of stroke.   General education about afib provided and questions answered. We also discussed his stroke risk and the risks and benefits of anticoagulation. Afib likely related to previous heavy alcohol use. He remains in SR and is now 4 weeks post spontaneous conversion. We discussed stopping anticoagulation. He would prefer to continue on Eliquis 5 mg BID for now given his CV score will be 2 in April.  Decrease Lopressor to 12.5 mg AM and 25 mg PM given bradycardia and fatigue.  Patient doing well with alcohol cessation.     2. HTN Stable, med changes as above.    3. Dysphagia  Follow up with Dr Havery Moros scheduled.      Follow up with Dr Johnsie Cancel in 3 months.      [facility] [address] [place], [place] [address] [phone] 12/08/2021 11:48 AM

## 2021-12-11 NOTE — Telephone Encounter (Signed)
Patient is returning call.  °

## 2021-12-11 NOTE — Telephone Encounter (Signed)
Called patient to schedule 3 month f/u with Dr. Johnsie Cancel per staff message from Rushie Goltz, RN at the afib clinic. Patient stated that he thought that was really far out and wanted me to send a message to Dr. Johnsie Cancel and his nurse just to make sure that his appointment in May is OK. Dr. Johnsie Cancel had an opening in April but pt declined that appt because he prefers late afternoons.

## 2021-12-11 NOTE — Telephone Encounter (Signed)
Left message for patient to call back  

## 2021-12-11 NOTE — Telephone Encounter (Signed)
Called patient back with Dr. Fabio Bering responses, that seeing him in May is fine. Encouraged patient to contact our office if he has any issues. Patient verbalized understanding.

## 2021-12-12 ENCOUNTER — Ambulatory Visit (HOSPITAL_COMMUNITY): Payer: Self-pay | Admitting: Physician Assistant

## 2021-12-29 ENCOUNTER — Ambulatory Visit (INDEPENDENT_AMBULATORY_CARE_PROVIDER_SITE_OTHER): Payer: 59 | Admitting: Gastroenterology

## 2021-12-29 ENCOUNTER — Other Ambulatory Visit (INDEPENDENT_AMBULATORY_CARE_PROVIDER_SITE_OTHER): Payer: 59

## 2021-12-29 ENCOUNTER — Encounter: Payer: Self-pay | Admitting: Gastroenterology

## 2021-12-29 VITALS — BP 120/60 | HR 72 | Ht 73.0 in | Wt 215.2 lb

## 2021-12-29 DIAGNOSIS — Z7901 Long term (current) use of anticoagulants: Secondary | ICD-10-CM

## 2021-12-29 DIAGNOSIS — R1310 Dysphagia, unspecified: Secondary | ICD-10-CM

## 2021-12-29 DIAGNOSIS — K760 Fatty (change of) liver, not elsewhere classified: Secondary | ICD-10-CM

## 2021-12-29 DIAGNOSIS — Z1211 Encounter for screening for malignant neoplasm of colon: Secondary | ICD-10-CM

## 2021-12-29 DIAGNOSIS — R933 Abnormal findings on diagnostic imaging of other parts of digestive tract: Secondary | ICD-10-CM | POA: Diagnosis not present

## 2021-12-29 LAB — HEPATIC FUNCTION PANEL
ALT: 12 U/L (ref 0–53)
AST: 17 U/L (ref 0–37)
Albumin: 4.3 g/dL (ref 3.5–5.2)
Alkaline Phosphatase: 55 U/L (ref 39–117)
Bilirubin, Direct: 0.2 mg/dL (ref 0.0–0.3)
Total Bilirubin: 0.8 mg/dL (ref 0.2–1.2)
Total Protein: 7.3 g/dL (ref 6.0–8.3)

## 2021-12-29 NOTE — Patient Instructions (Addendum)
If you are age 65 or older, your body mass index should be between 23-30. Your Body mass index is 28.39 kg/m. If this is out of the aforementioned range listed, please consider follow up with your Primary Care Provider.  If you are age 61 or younger, your body mass index should be between 19-25. Your Body mass index is 28.39 kg/m. If this is out of the aformentioned range listed, please consider follow up with your Primary Care Provider.   ________________________________________________________  The [facility] providers would like to encourage you to use [facility] to communicate with providers for non-urgent requests or questions.  Due to long hold times on the telephone, sending your provider a message by [facility] may be a faster and more efficient way to get a response.  Please allow 48 business hours for a response.  Please remember that this is for non-urgent requests.  _______________________________________________________   Please go to the lab in the basement of our building to have lab work done as you leave today. Hit "B" for basement when you get on the elevator.  When the doors open the lab is on your left.  We will call you with the results. Thank you.  Continue Protonix once daily.  We will discuss EGD/Colonoscopy once we have discussed with your Cardiologist.  Thank you for entrusting me with your care and for choosing [facility], Dr. Fyffe Cellar

## 2021-12-29 NOTE — Progress Notes (Addendum)
HPI :  66 year old male here for a follow-up visit in the office for abnormal CT scan/dysphagia, alcohol abuse with fatty liver.  I first met him in the hospital on December 15 when he was hospitalized with A-fib with RVR after significant amount of alcohol ingestion with bourbon and taking Viagras.  During this time he endorsed dysphagia, CT scan was done during his admission showing thickening of his esophagus concerning for esophagitis as well as fatty liver without any evidence of cirrhosis.  He had endorsed ongoing mild intermittent dysphagia for months.  He was in the hospital a few days on heparin drip and Cardizem drip to get his heart rate controlled.  We had discussed doing an EGD prior to discharge but he declined it and wanted to leave the hospital.  Since his discharge from the hospital he has been completely abstinent from alcohol.  He states prior to this he was been drinking bourbon heavily on and off for years.  Since has been abstinent from alcohol he has been maintained in sinus rhythm that he can tell, although is on Eliquis per cardiology, as well as metoprolol which has been keeping his heart rate controlled.  Since discharge has been on Protonix 40 mg initially twice daily for a few weeks and now once daily.  He is tolerating it well and states it has controlled his reflux symptoms and his dysphagia appears to have resolved.  He has questions about long-term side effects of chronic PPI use which we discussed.  He is eating well, no nausea or vomiting.  States his heartburn or reflux symptoms are generally well controlled.  He has occasional cramps in his lower abdomen.  No blood in his stools.  Moves his bowels regularly.  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  He does not think it bothers him too much.  States his mother had Barrett's esophagus and had part of her stomach removed.  Details of this he does not  know.  He is not sure how long he is going to need to maintain on Eliquis, if this is indefinite or not.  In regards to his fatty liver, his liver enzymes were initially elevated while admitted to the hospital but then normalized at time of discharge.  He reports he has been tested negative for hepatitis C in recent years, work-up was not further done in the hospital given his LFTs normalized at the time.  Overall he is feeling better since hospitalization and doing well staying off the alcohol.  Of note he has never had a prior colonoscopy and we discussed what this would entail.    Echo 11/09/21 demonstrated  1. Left ventricular ejection fraction, by estimation, is 65 to 70%. Grade II DD  CT abdomen / pelvis 11/08/21: IMPRESSION: Wall thickening of distal esophagus is noted concerning for esophagitis. Endoscopy may be performed for further evaluation. Hepatic steatosis. Aortic Atherosclerosis (ICD10-I70.0).     Past Medical History:  Diagnosis Date   Alcohol abuse    Asthma    Atrial fibrillation (HCC)    Enlarged prostate    Fatty liver    Hypertension    Thyroid disease      Past Surgical History:  Procedure Laterality Date   TONSILLECTOMY     TOTAL HIP ARTHROPLASTY Bilateral    VARICOSE VEIN SURGERY     x 2-3   Family History  Problem Relation Age of Onset  Lung cancer Father    Stroke Maternal Grandfather    Lung cancer Paternal Grandfather    Colon cancer Neg Hx    Esophageal cancer Neg Hx    Stomach cancer Neg Hx    Pancreatic cancer Neg Hx    Liver disease Neg Hx    Social History   Tobacco Use   Smoking status: Never    Passive exposure: Past   Smokeless tobacco: Never  Substance Use Topics   Alcohol use: Not Currently    Comment: 10/2021 was last alcohol intake per patient (as of 12/29/21)   Drug use: Yes    Types: Marijuana   Current Outpatient Medications  Medication Sig Dispense Refill   albuterol (VENTOLIN HFA) 108 (90 Base) MCG/ACT  inhaler Inhale 1-2 puffs into the lungs every 4 (four) hours as needed for wheezing.     apixaban (ELIQUIS) 5 MG TABS tablet Take 1 tablet by mouth twice daily 60 tablet 3   folic acid (FOLVITE) 1 MG tablet Take 1 tablet (1 mg total) by mouth daily. 30 tablet 0   levothyroxine (SYNTHROID) 150 MCG tablet Take 1 tablet (150 mcg total) by mouth daily. 30 tablet 0   metoprolol tartrate (LOPRESSOR) 25 MG tablet Take 1/2 tablet in the AM and 1 tablet in the PM 60 tablet 2   tamsulosin (FLOMAX) 0.4 MG CAPS capsule Take 1 capsule (0.4 mg total) by mouth daily. 30 capsule 0   temazepam (RESTORIL) 30 MG capsule Take 30 mg by mouth at bedtime.     thiamine 100 MG tablet Take 1 tablet (100 mg total) by mouth daily. 30 tablet 0   triamcinolone cream (KENALOG) 0.1 % Apply 1 application topically as needed.     pantoprazole (PROTONIX) 40 MG tablet Take 1 tablet (40 mg total) by mouth daily. 90 tablet 0   No current facility-administered medications for this visit.   No Known Allergies   Review of Systems: All systems reviewed and negative except where noted in HPI.   Lab Results  Component Value Date   WBC 5.6 11/10/2021   HGB 14.1 11/10/2021   HCT 40.7 11/10/2021   MCV 95.1 11/10/2021   PLT 224 11/10/2021    Lab Results  Component Value Date   CREATININE 1.11 11/10/2021   BUN 10 11/10/2021   NA 133 (L) 11/10/2021   K 4.3 11/10/2021   CL 100 11/10/2021   CO2 26 11/10/2021      Physical Exam: BP 120/60    Pulse 72    Ht '6\' 1"'  (1.854 m)    Wt 215 lb 3.2 oz (97.6 kg)    BMI 28.39 kg/m  Constitutional: Pleasant,well-developed, male in no acute distress. Abdominal: Soft, nondistended, nontender. (+) diastasis recti.  No hepatomegaly. Extremities: no edema Neurological: Alert and oriented to person place and time. Skin: Skin is warm and dry. No rashes noted. Psychiatric: Normal mood and affect. Behavior is normal.   ASSESSMENT AND PLAN: 66 year old male here for reassessment of the  following:  Abnormal CT scan esophagus (thickening) Dysphagia Fatty liver Anticoagulated Colon cancer screening  As above, patient hospitalized following significant alcohol use leading to A-fib with RVR.  Noted to have fatty liver without cirrhosis, also with esophageal thickening on CT scan in the setting of dysphagia.  Since discharge off all alcohol and doing much better in all aspects.  Taking Protonix 40 mg once daily which has resolved his dysphagia and reflux.  I do think he warrants an EGD to clarify  CT findings and make sure no underlying Barrett's esophagus or concerning lesion there.  He is also due for routine colon cancer screening.  We discussed what these studies entailed, risks and benefits.  Question is the timing of this and if and when he can hold his Eliquis for 2 days for these procedures.  We will reach out to his cardiology team to determine timeframe of this and get back to him.  In the interim he will continue Protonix once daily.  I discussed long-term risks and benefits of this, long-term want to use the lowest dose needed to control symptoms or use of an alternative.  Will await his EGD to decide long-term regimen.  Otherwise we discussed fatty liver in general, risks for cirrhosis but no evidence for that at this time.  Suspect alcohol has driven the fatty liver.  He will continue to work on abstinence.  We will repeat LFTs at this time to make sure they remain normal and check his immunity to hepatitis a and B and immunize if needed.  If the transaminitis returns we will further work-up with additional serologies.  He states he has been tested and negative for hepatitis C.  Of note he has a diastases recti on exam, this does not bother him much and I do not recommend any surgical intervention at this time.  Plan: - will touch base with his cardiology team about if / when he can hold anticoagulation for elective EGD and colonoscopy - schedule EGD and colonoscopy when  cleared by cardiology, would need to hold Eliquis for 2 days pre-procedure - continue protonix once daily - continue alcohol abstinence - counseled on fatty liver - check  LFTs, hepatitis A total antibody, hepatitis B surface antibody  Jolly Mango, MD [facility]

## 2022-01-01 ENCOUNTER — Telehealth: Payer: Self-pay

## 2022-01-01 LAB — HEPATITIS A ANTIBODY, TOTAL: Hepatitis A AB,Total: NONREACTIVE

## 2022-01-01 LAB — HEPATITIS B SURFACE ANTIBODY,QUALITATIVE: Hep B S Ab: NONREACTIVE

## 2022-01-01 NOTE — Telephone Encounter (Signed)
Noted, per previous note pt was offered contact information to [facility] and declined.

## 2022-01-01 NOTE — Telephone Encounter (Signed)
-----   Message from Yetta Flock, MD sent at 01/01/2022  7:55 AM EST ----- Regarding: scheduling EGD and colonoscopy Averyana Pillars can you please let the patient know I spoke with his cardiologist, they think he is okay to proceed with EGD and colonoscopy at this time if you can help schedule him. He will need to hold Eliquis 2 days prior to the exam. Thanks  Dr. Loni Muse ----- Message ----- From: Josue Hector, MD Sent: 12/30/2021   5:59 PM EST To: Yetta Flock, MD Subject: RE: mutual patient                             He is stable and in sinus ok to do procedures and hold eliquis for 2 days prior  ----- Message ----- From: Yetta Flock, MD Sent: 12/29/2021   5:40 PM EST To: Josue Hector, MD Subject: mutual patient                                 Jeffrey Farrell,  Wanted to touch base about this mutual patient.  He is in need of an elective EGD and colonoscopy once you think he is stable from a cardiovascular standpoint and can hold his Eliquis for 2 days or so.  I think he is not scheduled to see you again until early May?  Do think he would be able to have these procedures done in the interim or would you prefer to see him first?  He is stable and there is no rush, he can wait a few months if need be.  Thanks for your insight.  Richardson Landry

## 2022-01-01 NOTE — Telephone Encounter (Signed)
Called and spoke with patient to set up EGD/colon appt. Pt states that he does not have a care partner, he states that he could get a cab. I told the patient that he would have to have someone that is able to stay at our facility while he is having his procedure. He wanted to know if this is something new, he stated that he hasn't heard that before. He states that he knows that he can't drive. I offered to give him the information to [facility] and he declined. He states that the only person in the area is his sister and she takes care of her ill husband. She states that she won't be able to bring him.   He states that he will have to think about this and call us back.

## 2022-01-01 NOTE — Telephone Encounter (Signed)
Okay thanks [employer]. Sorry to hear this. He should really look into [facility] if he does not have a ride otherwise, they can help him with this so he can have his procedures done.

## 2022-03-06 DIAGNOSIS — Z03818 Encounter for observation for suspected exposure to other biological agents ruled out: Secondary | ICD-10-CM | POA: Diagnosis not present

## 2022-03-06 DIAGNOSIS — J069 Acute upper respiratory infection, unspecified: Secondary | ICD-10-CM | POA: Diagnosis not present

## 2022-03-08 ENCOUNTER — Other Ambulatory Visit: Payer: Self-pay | Admitting: Internal Medicine

## 2022-03-08 ENCOUNTER — Ambulatory Visit
Admission: RE | Admit: 2022-03-08 | Discharge: 2022-03-08 | Disposition: A | Discharge status: Home / Self Care | Payer: Medicare Other | Source: Ambulatory Visit | Attending: Internal Medicine | Admitting: Internal Medicine

## 2022-03-08 DIAGNOSIS — R058 Other specified cough: Secondary | ICD-10-CM | POA: Diagnosis not present

## 2022-03-08 DIAGNOSIS — J069 Acute upper respiratory infection, unspecified: Secondary | ICD-10-CM

## 2022-03-08 DIAGNOSIS — R0602 Shortness of breath: Secondary | ICD-10-CM | POA: Diagnosis not present

## 2022-03-15 DIAGNOSIS — N401 Enlarged prostate with lower urinary tract symptoms: Secondary | ICD-10-CM | POA: Diagnosis not present

## 2022-03-15 DIAGNOSIS — E039 Hypothyroidism, unspecified: Secondary | ICD-10-CM | POA: Diagnosis not present

## 2022-03-15 DIAGNOSIS — J309 Allergic rhinitis, unspecified: Secondary | ICD-10-CM | POA: Diagnosis not present

## 2022-03-15 DIAGNOSIS — R350 Frequency of micturition: Secondary | ICD-10-CM | POA: Diagnosis not present

## 2022-03-20 NOTE — Progress Notes (Signed)
? ? ?Primary Care Physician: Jettie Booze, NP ?Primary Cardiologist: Dr Johnsie Cancel ?Primary Electrophysiologist: none ? ? ?Jeffrey Farrell is a 66 y.o. male with a history of HTN, alcohol abuse, fatty liver, atrial fibrillation who presents for f/u The patient was initially diagnosed with atrial fibrillation 10/2021 after presenting to the ED with symptoms of chest pain and SOB after heavy alcohol use and beyond prescribed amounts of Viagra. He spontaneously converted at the hospital. Patient is on Eliquis for a CHADS2VASC score of 2. He was given a 30 day event monitor to wear as an outpatient. He is in SB today with some mild symptoms of fatigue. He was only able to wear the event monitor for ~ 2 weeks due to skin irritation. He has not any any alcohol to drink since discharge.  ? ?Today, he denies symptoms of palpitations, chest pain, shortness of breath, orthopnea, PND, lower extremity edema, dizziness, presyncope, syncope, snoring, daytime somnolence, bleeding, or neurologic sequela. The patient is tolerating medications without difficulties and is otherwise without complaint today.  ? ?He has not had CAD r/o and important in regard to his PAF and Viagra use  ? ? ?Atrial Fibrillation Risk Factors: ? ?he does not have symptoms or diagnosis of sleep apnea. ?he does not have a history of rheumatic fever. ?he does have a history of alcohol use. ?The patient does not have a history of early familial atrial fibrillation or other arrhythmias. ? ?he has a BMI of There is no height or weight on file to calculate BMI.Marland Kitchen ?There were no vitals filed for this visit. ? ? ?Family History  ?Problem Relation Age of Onset  ? Lung cancer Father   ? Stroke Maternal Grandfather   ? Lung cancer Paternal Grandfather   ? Colon cancer Neg Hx   ? Esophageal cancer Neg Hx   ? Stomach cancer Neg Hx   ? Pancreatic cancer Neg Hx   ? Liver disease Neg Hx   ? ? ? ?Atrial Fibrillation Management history: ? ?Previous antiarrhythmic drugs:  none ?Previous cardioversions: none ?Previous ablations: none ?CHADS2VASC score: 2 ?Anticoagulation history: Eliquis ? ? ?Past Medical History:  ?Diagnosis Date  ? Alcohol abuse   ? Asthma   ? Atrial fibrillation ([place])   ? Enlarged prostate   ? Fatty liver   ? Hypertension   ? Thyroid disease   ? ?Past Surgical History:  ?Procedure Laterality Date  ? TONSILLECTOMY    ? TOTAL HIP ARTHROPLASTY Bilateral   ? VARICOSE VEIN SURGERY    ? x 2-3  ? ? ?Current Outpatient Medications  ?Medication Sig Dispense Refill  ? albuterol (VENTOLIN HFA) 108 (90 Base) MCG/ACT inhaler Inhale 1-2 puffs into the lungs every 4 (four) hours as needed for wheezing.    ? apixaban (ELIQUIS) 5 MG TABS tablet Take 1 tablet by mouth twice daily 60 tablet 3  ? folic acid (FOLVITE) 1 MG tablet Take 1 tablet (1 mg total) by mouth daily. 30 tablet 0  ? levothyroxine (SYNTHROID) 150 MCG tablet Take 1 tablet (150 mcg total) by mouth daily. 30 tablet 0  ? metoprolol tartrate (LOPRESSOR) 25 MG tablet Take 1/2 tablet in the AM and 1 tablet in the PM 60 tablet 2  ? pantoprazole (PROTONIX) 40 MG tablet Take 1 tablet (40 mg total) by mouth daily. 90 tablet 0  ? tamsulosin (FLOMAX) 0.4 MG CAPS capsule Take 1 capsule (0.4 mg total) by mouth daily. 30 capsule 0  ? temazepam (RESTORIL) 30 MG capsule  Take 30 mg by mouth at bedtime.    ? thiamine 100 MG tablet Take 1 tablet (100 mg total) by mouth daily. 30 tablet 0  ? triamcinolone cream (KENALOG) 0.1 % Apply 1 application topically as needed.    ? ?No current facility-administered medications for this visit.  ? ? ?No Known Allergies ? ?Social History  ? ?Socioeconomic History  ? Marital status: Significant Other  ?  Spouse name: Not on file  ? Number of children: Not on file  ? Years of education: Not on file  ? Highest education level: Not on file  ?Occupational History  ? Not on file  ?Tobacco Use  ? Smoking status: Never  ?  Passive exposure: Past  ? Smokeless tobacco: Never  ?Substance and Sexual Activity  ?  Alcohol use: Not Currently  ?  Comment: 10/2021 was last alcohol intake per patient (as of 12/29/21)  ? Drug use: Yes  ?  Types: Marijuana  ? Sexual activity: Yes  ?Other Topics Concern  ? Not on file  ?Social History Narrative  ? Not on file  ? ?Social Determinants of Health  ? ?Financial Resource Strain: Not on file  ?Food Insecurity: Not on file  ?Transportation Needs: Not on file  ?Physical Activity: Not on file  ?Stress: Not on file  ?Social Connections: Not on file  ?Intimate Partner Violence: Not on file  ? ? ? ?ROS- All systems are reviewed and negative except as per the HPI above. ? ?Physical Exam: ?There were no vitals filed for this visit. ? ?Affect appropriate ?Healthy:  appears stated age ?HEENT: normal ?Neck supple with no adenopathy ?JVP normal no bruits no thyromegaly ?Lungs clear with no wheezing and good diaphragmatic motion ?Heart:  S1/S2 no murmur, no rub, gallop or click ?PMI normal ?Abdomen: benighn, BS positve, no tenderness, no AAA ?no bruit.  No HSM or HJR ?Distal pulses intact with no bruits ?No edema ?Neuro non-focal ?Skin warm and dry ?No muscular weakness ? ? ?Wt Readings from Last 3 Encounters:  ?12/29/21 215 lb 3.2 oz (97.6 kg)  ?12/08/21 218 lb 6.4 oz (99.1 kg)  ?11/09/21 226 lb 11.2 oz (102.8 kg)  ? ? ? ECG:   12/08/21 SB rate 52 normal  ? ? ?Echo 11/09/21 demonstrated  ?1. Left ventricular ejection fraction, by estimation, is 65 to 70%. The  ?left ventricle has normal function. The left ventricle has no regional  ?wall motion abnormalities. There is moderate concentric left ventricular  ?hypertrophy. Left ventricular diastolic parameters are consistent with Grade II diastolic dysfunction (pseudonormalization).  ? 2. Right ventricular systolic function is normal. The right ventricular  ?size is normal. There is normal pulmonary artery systolic pressure.  ? 3. The mitral valve is normal in structure. No evidence of mitral valve  ?regurgitation. No evidence of mitral stenosis.  ? 4. The  aortic valve is tricuspid. There is mild calcification of the  ?aortic valve. Aortic valve regurgitation is not visualized. Aortic valve  ?sclerosis is present, with no evidence of aortic valve stenosis. Aortic  ?valve area, by VTI measures 2.88 cm?Marland Kitchen Aortic valve mean gradient measures 3.0 mmHg. Aortic valve Vmax measures 1.20 m/s.  ? 5. The inferior vena cava is normal in size with greater than 50%  ?respiratory variability, suggesting right atrial pressure of 3 mmHg.  ? ? ?    ? ? ?ASSESSMENT AND PLAN: ?1. Paroxysmal Atrial Fibrillation (ICD10:  I48.0) ?The patient's CHA2DS2-VASc score is 2, turned 66 in April PAF with spontaneous  conversion likely due to ETOH abuse If he can stay sober can likely stop anticoagulation Lopressor decreased 12/08/21 for bradycardia and fatigue improved  ? ?2. HTN ?Stable, med changes as above.  ? ?3. Dysphagia  ?Follow up with Dr Havery Moros scheduled. Ok to hold eliquis for any EGD or colonoscopy at this point Continue protonix Mother has history of Barrett's esophagus  ? ?4. CAD:  risk and PAF with Viagra use ECG normal will order POET/GXT  ? ? ?F/U in 6 months  ? ?Jenkins Rouge MD [facility] ? ?

## 2022-03-29 ENCOUNTER — Encounter: Payer: Self-pay | Admitting: Cardiovascular Disease

## 2022-03-29 ENCOUNTER — Other Ambulatory Visit: Payer: Self-pay

## 2022-03-29 ENCOUNTER — Ambulatory Visit: Payer: Medicare Other | Admitting: Cardiovascular Disease

## 2022-03-29 VITALS — BP 118/62 | HR 73 | Ht 73.0 in | Wt 199.6 lb

## 2022-03-29 DIAGNOSIS — I480 Paroxysmal atrial fibrillation: Secondary | ICD-10-CM | POA: Diagnosis not present

## 2022-03-29 DIAGNOSIS — R0789 Other chest pain: Secondary | ICD-10-CM

## 2022-03-29 DIAGNOSIS — I10 Essential (primary) hypertension: Secondary | ICD-10-CM | POA: Diagnosis not present

## 2022-03-29 NOTE — Patient Instructions (Addendum)
Medication Instructions:  ?Your physician recommends that you continue on your current medications as directed. Please refer to the Current Medication list given to you today. ? ?*If you need a refill on your cardiac medications before your next appointment, please call your pharmacy* ? ? ?Lab Work: ?None ?If you have labs (blood work) drawn today and your tests are completely normal, you will receive your results only by: ?MyChart Message (if you have MyChart) OR ?A paper copy in the mail ?If you have any lab test that is abnormal or we need to change your treatment, we will call you to review the results. ? ? ?Testing: ?Your physician has requested that you have an exercise tolerance test. For further information please visit [email or web address] Please also follow instruction sheet, as given. ? ? ?Follow-Up: ?At [facility], you and your health needs are our priority.  As part of our continuing mission to provide you with exceptional heart care, we have created designated Provider Care Teams.  These Care Teams include your primary Cardiologist (physician) and Advanced Practice Providers (APPs -  Physician Assistants and Nurse Practitioners) who all work together to provide you with the care you need, when you need it. ? ?Your next appointment:   ?6 month(s) ? ?The format for your next appointment:   ?In Person ? ?Provider:   ?Charlton Haws, MD   ? ? ? ?  ?

## 2022-04-03 ENCOUNTER — Other Ambulatory Visit (HOSPITAL_COMMUNITY): Payer: Self-pay | Admitting: Physician Assistant

## 2022-05-17 DIAGNOSIS — R350 Frequency of micturition: Secondary | ICD-10-CM | POA: Diagnosis not present

## 2022-05-17 DIAGNOSIS — N529 Male erectile dysfunction, unspecified: Secondary | ICD-10-CM | POA: Diagnosis not present

## 2022-05-17 DIAGNOSIS — N401 Enlarged prostate with lower urinary tract symptoms: Secondary | ICD-10-CM | POA: Diagnosis not present

## 2022-05-31 ENCOUNTER — Ambulatory Visit (INDEPENDENT_AMBULATORY_CARE_PROVIDER_SITE_OTHER): Payer: Medicare Other

## 2022-05-31 DIAGNOSIS — R0789 Other chest pain: Secondary | ICD-10-CM

## 2022-05-31 DIAGNOSIS — I480 Paroxysmal atrial fibrillation: Secondary | ICD-10-CM | POA: Diagnosis not present

## 2022-05-31 DIAGNOSIS — I10 Essential (primary) hypertension: Secondary | ICD-10-CM | POA: Diagnosis not present

## 2022-05-31 LAB — EXERCISE TOLERANCE TEST
Angina Index: 0
Duke Treadmill Score: 7
Estimated workload: 7.7
Exercise duration (min): 7 min
Exercise duration (sec): 0 s
MPHR: 155 {beats}/min
Peak HR: 139 {beats}/min
Percent HR: 89 %
RPE: 17
Rest HR: 62 {beats}/min
ST Depression (mm): 0 mm

## 2022-06-25 DIAGNOSIS — Z20822 Contact with and (suspected) exposure to covid-19: Secondary | ICD-10-CM | POA: Diagnosis not present

## 2022-06-28 DIAGNOSIS — R350 Frequency of micturition: Secondary | ICD-10-CM | POA: Diagnosis not present

## 2022-06-28 DIAGNOSIS — N401 Enlarged prostate with lower urinary tract symptoms: Secondary | ICD-10-CM | POA: Diagnosis not present

## 2022-06-28 DIAGNOSIS — N529 Male erectile dysfunction, unspecified: Secondary | ICD-10-CM | POA: Diagnosis not present

## 2022-08-01 ENCOUNTER — Ambulatory Visit
Admission: RE | Admit: 2022-08-01 | Discharge: 2022-08-01 | Disposition: A | Discharge status: Home / Self Care | Payer: Medicare Other | Source: Ambulatory Visit | Attending: Internal Medicine | Admitting: Internal Medicine

## 2022-08-01 ENCOUNTER — Other Ambulatory Visit: Payer: Self-pay | Admitting: Internal Medicine

## 2022-08-01 DIAGNOSIS — E039 Hypothyroidism, unspecified: Secondary | ICD-10-CM | POA: Diagnosis not present

## 2022-08-01 DIAGNOSIS — R634 Abnormal weight loss: Secondary | ICD-10-CM | POA: Diagnosis not present

## 2022-08-01 DIAGNOSIS — M791 Myalgia, unspecified site: Secondary | ICD-10-CM | POA: Diagnosis not present

## 2022-08-01 DIAGNOSIS — M25561 Pain in right knee: Secondary | ICD-10-CM

## 2022-08-01 DIAGNOSIS — M25552 Pain in left hip: Secondary | ICD-10-CM | POA: Diagnosis not present

## 2022-08-01 DIAGNOSIS — M2550 Pain in unspecified joint: Secondary | ICD-10-CM | POA: Diagnosis not present

## 2022-09-06 ENCOUNTER — Encounter: Payer: Self-pay | Admitting: Cardiovascular Disease

## 2022-09-11 DIAGNOSIS — Z0001 Encounter for general adult medical examination with abnormal findings: Secondary | ICD-10-CM | POA: Diagnosis not present

## 2022-09-11 DIAGNOSIS — I10 Essential (primary) hypertension: Secondary | ICD-10-CM | POA: Diagnosis not present

## 2022-09-11 DIAGNOSIS — F411 Generalized anxiety disorder: Secondary | ICD-10-CM | POA: Diagnosis not present

## 2022-09-11 DIAGNOSIS — I4891 Unspecified atrial fibrillation: Secondary | ICD-10-CM | POA: Diagnosis not present

## 2022-09-14 DIAGNOSIS — M1711 Unilateral primary osteoarthritis, right knee: Secondary | ICD-10-CM | POA: Diagnosis not present

## 2022-09-14 DIAGNOSIS — Z96643 Presence of artificial hip joint, bilateral: Secondary | ICD-10-CM | POA: Diagnosis not present

## 2022-09-14 DIAGNOSIS — M25561 Pain in right knee: Secondary | ICD-10-CM | POA: Diagnosis not present

## 2022-09-26 DIAGNOSIS — I4891 Unspecified atrial fibrillation: Secondary | ICD-10-CM | POA: Diagnosis not present

## 2022-09-26 DIAGNOSIS — I10 Essential (primary) hypertension: Secondary | ICD-10-CM | POA: Diagnosis not present

## 2022-09-26 DIAGNOSIS — G4719 Other hypersomnia: Secondary | ICD-10-CM | POA: Diagnosis not present

## 2022-09-26 DIAGNOSIS — F411 Generalized anxiety disorder: Secondary | ICD-10-CM | POA: Diagnosis not present

## 2022-09-27 NOTE — Progress Notes (Signed)
Primary Care Physician: [facility] Primary Cardiologist: Dr Gonzella Lex is a 66 y.o. male with a history of HTN, alcohol abuse, fatty liver, atrial fibrillation who presents for f/u The patient was initially diagnosed with atrial fibrillation 10/2021 after presenting to the ED with symptoms of chest pain and SOB after heavy alcohol use and beyond prescribed amounts of Viagra. He spontaneously converted at the hospital. Patient is on Eliquis for a CHADS2VASC score of 2. He was given a 30 day event monitor to wear as an outpatient.  He was only able to wear the event monitor for ~ 2 weeks due to skin irritation. He has not any any alcohol to drink since discharge.   Today, he denies symptoms of palpitations, chest pain, shortness of breath, orthopnea, PND, lower extremity edema, dizziness, presyncope, syncope, snoring, daytime somnolence, bleeding, or neurologic sequela. The patient is tolerating medications without difficulties and is otherwise without complaint today.   ETT normal 2022/06/02    Mom died in 2023-10-03 He is in [place] now Unable to arrange colonoscopy do to transportation issues and not having someone to wait with him Describes unintentional weight loss > 30 lbs this year   Atrial Fibrillation Risk Factors:  he does not have symptoms or diagnosis of sleep apnea. he does not have a history of rheumatic fever. he does have a history of alcohol use. The patient does not have a history of early familial atrial fibrillation or other arrhythmias.  he has a BMI of Body mass index is 24.22 kg/m.Marland Kitchen Filed Weights   10/05/22 1423  Weight: 178 lb 9.6 oz (81 kg)     Family History  Problem Relation Age of Onset   Lung cancer Father    Stroke Maternal Grandfather    Lung cancer Paternal Grandfather    Colon cancer Neg Hx    Esophageal cancer Neg Hx    Stomach cancer Neg Hx    Pancreatic cancer Neg Hx    Liver disease Neg Hx      Atrial  Fibrillation Management history:  Previous antiarrhythmic drugs: none Previous cardioversions: none Previous ablations: none CHADS2VASC score: 2 Anticoagulation history: Eliquis   Past Medical History:  Diagnosis Date   Alcohol abuse    Asthma    Atrial fibrillation (HCC)    Enlarged prostate    Fatty liver    Hypertension    Thyroid disease    Past Surgical History:  Procedure Laterality Date   TONSILLECTOMY     TOTAL HIP ARTHROPLASTY Bilateral    VARICOSE VEIN SURGERY     x 2-3    Current Outpatient Medications  Medication Sig Dispense Refill   folic acid (FOLVITE) 1 MG tablet Take 1 tablet (1 mg total) by mouth daily. 30 tablet 0   levothyroxine (SYNTHROID) 150 MCG tablet Take 1 tablet (150 mcg total) by mouth daily. 30 tablet 0   metoprolol tartrate (LOPRESSOR) 25 MG tablet TAKE 1/2 (ONE-HALF) TABLET BY MOUTH IN THE MORNING AND 1 IN THE EVENING 135 tablet 3   pantoprazole (PROTONIX) 40 MG tablet Take 1 tablet (40 mg total) by mouth daily. 90 tablet 0   tamsulosin (FLOMAX) 0.4 MG CAPS capsule Take 1 capsule (0.4 mg total) by mouth daily. 30 capsule 0   temazepam (RESTORIL) 30 MG capsule Take 30 mg by mouth at bedtime.     thiamine 100 MG tablet Take 1 tablet (100 mg total) by mouth daily. 30 tablet 0  triamcinolone cream (KENALOG) 0.1 % Apply 1 application topically as needed.     apixaban (ELIQUIS) 5 MG TABS tablet Take 1 tablet by mouth twice daily (Patient not taking: Reported on 10/05/2022) 60 tablet 3   fexofenadine (ALLEGRA) 60 MG tablet Take 60 mg by mouth daily. (Patient not taking: Reported on 10/05/2022)     No current facility-administered medications for this visit.    No Known Allergies  Social History   Socioeconomic History   Marital status: Single    Spouse name: Not on file   Number of children: Not on file   Years of education: Not on file   Highest education level: Not on file  Occupational History   Not on file  Tobacco Use   Smoking  status: Never    Passive exposure: Past   Smokeless tobacco: Never  Substance and Sexual Activity   Alcohol use: Not Currently    Comment: 10/2021 was last alcohol intake per patient (as of 12/29/21)   Drug use: Yes    Types: Marijuana   Sexual activity: Yes  Other Topics Concern   Not on file  Social History Narrative   Not on file   Social Determinants of Health   Financial Resource Strain: Not on file  Food Insecurity: Not on file  Transportation Needs: Not on file  Physical Activity: Not on file  Stress: Not on file  Social Connections: Not on file  Intimate Partner Violence: Not on file     ROS- All systems are reviewed and negative except as per the HPI above.  Physical Exam: Vitals:   10/05/22 1423  BP: 100/66  Pulse: 73  SpO2: 99%  Weight: 178 lb 9.6 oz (81 kg)  Height: 6' (1.829 m)    Affect appropriate Healthy:  appears stated age HEENT: normal Neck supple with no adenopathy JVP normal no bruits no thyromegaly Lungs clear with no wheezing and good diaphragmatic motion Heart:  S1/S2 no murmur, no rub, gallop or click PMI normal Abdomen: benighn, BS positve, no tenderness, no AAA no bruit.  No HSM or HJR Distal pulses intact with no bruits No edema Neuro non-focal Skin warm and dry No muscular weakness   Wt Readings from Last 3 Encounters:  10/05/22 178 lb 9.6 oz (81 kg)  03/29/22 199 lb 9.6 oz (90.5 kg)  12/29/21 215 lb 3.2 oz (97.6 kg)     ECG:   12/08/21 SB rate 52 normal 10/05/2022 NSR rate 68 normal    Echo 11/09/21 demonstrated  1. Left ventricular ejection fraction, by estimation, is 65 to 70%. The  left ventricle has normal function. The left ventricle has no regional  wall motion abnormalities. There is moderate concentric left ventricular  hypertrophy. Left ventricular diastolic parameters are consistent with Grade II diastolic dysfunction (pseudonormalization).   2. Right ventricular systolic function is normal. The right  ventricular  size is normal. There is normal pulmonary artery systolic pressure.   3. The mitral valve is normal in structure. No evidence of mitral valve  regurgitation. No evidence of mitral stenosis.   4. The aortic valve is tricuspid. There is mild calcification of the  aortic valve. Aortic valve regurgitation is not visualized. Aortic valve  sclerosis is present, with no evidence of aortic valve stenosis. Aortic  valve area, by VTI measures 2.88 cm. Aortic valve mean gradient measures 3.0 mmHg. Aortic valve Vmax measures 1.20 m/s.   5. The inferior vena cava is normal in size with greater than 50%  respiratory variability, suggesting right atrial pressure of 3 mmHg.          ASSESSMENT AND PLAN: 1. Paroxysmal Atrial Fibrillation (ICD10:  I48.0) The patient's CHA2DS2-VASc score is 2, turned 66 in April PAF with spontaneous conversion likely due to ETOH abuse If he can stay sober can likely stop anticoagulation Lopressor decreased 12/08/21 for bradycardia and fatigue improved   2. HTN Stable, med changes as above.   3. Dysphagia  Follow up with Dr Havery Moros scheduled. Ok to hold eliquis for any EGD or colonoscopy at this point Continue protonix Mother has history of Barrett's esophagus   4. CAD:  risk and PAF with Viagra use ECG normal will order Normal POET 05/31/22   5. Weight Loss:  f/u Dr Sabra Heck check thyroid. Discussed need to screen colon ? With cologuard if he can't do colonoscopy Also consider CT chest/abdomen CT abdomen 11/08/21 only thickening of esophagus from inflammation CXR at that time NAD    F/U in a year    Jenkins Rouge MD [facility]

## 2022-10-05 ENCOUNTER — Encounter: Payer: Self-pay | Admitting: Cardiovascular Disease

## 2022-10-05 ENCOUNTER — Telehealth: Payer: Self-pay | Admitting: Cardiovascular Disease

## 2022-10-05 ENCOUNTER — Ambulatory Visit: Payer: Medicare Other | Attending: Cardiovascular Disease | Admitting: Cardiovascular Disease

## 2022-10-05 VITALS — BP 100/66 | HR 73 | Ht 72.0 in | Wt 178.6 lb

## 2022-10-05 DIAGNOSIS — I480 Paroxysmal atrial fibrillation: Secondary | ICD-10-CM | POA: Diagnosis not present

## 2022-10-05 DIAGNOSIS — I10 Essential (primary) hypertension: Secondary | ICD-10-CM

## 2022-10-05 NOTE — Telephone Encounter (Signed)
Updated patient's medication list. Will forward to Dr. Eden Emms so he is aware.

## 2022-10-05 NOTE — Patient Instructions (Addendum)
Medication Instructions:  Your physician recommends that you continue on your current medications as directed. Please refer to the Current Medication list given to you today.  *If you need a refill on your cardiac medications before your next appointment, please call your pharmacy*  Lab Work: If you have labs (blood work) drawn today and your tests are completely normal, you will receive your results only by: MyChart Message (if you have MyChart) OR A paper copy in the mail If you have any lab test that is abnormal or we need to change your treatment, we will call you to review the results.  Testing/Procedures: None ordered today.  Follow-Up: At [facility], you and your health needs are our priority.  As part of our continuing mission to provide you with exceptional heart care, we have created designated Provider Care Teams.  These Care Teams include your primary Cardiologist (physician) and Advanced Practice Providers (APPs -  Physician Assistants and Nurse Practitioners) who all work together to provide you with the care you need, when you need it.  We recommend signing up for the patient portal called "MyChart".  Sign up information is provided on this After Visit Summary.  MyChart is used to connect with patients for Virtual Visits (Telemedicine).  Patients are able to view lab/test results, encounter notes, upcoming appointments, etc.  Non-urgent messages can be sent to your provider as well.   To learn more about what you can do with MyChart, go to [email or web address].    Your next appointment:   12 month(s)  The format for your next appointment:   In Person  Provider:   Charlton Haws, MD     Other Instructions   Important Information About Sugar

## 2022-10-05 NOTE — Telephone Encounter (Signed)
Pt c/o medication issue:  1. Name of Medication: Sildenafil   2. How are you currently taking this medication (dosage and times per day)? 5 mg daily   3. Are you having a reaction (difficulty breathing--STAT)?   4. What is your medication issue? Pt states he forgot to tell Dr. Eden Emms at his appt today that his urologist at [facility] prescribed this for him

## 2022-10-09 DIAGNOSIS — M1711 Unilateral primary osteoarthritis, right knee: Secondary | ICD-10-CM | POA: Diagnosis not present

## 2022-10-09 DIAGNOSIS — Z96643 Presence of artificial hip joint, bilateral: Secondary | ICD-10-CM | POA: Diagnosis not present

## 2022-12-17 DIAGNOSIS — N401 Enlarged prostate with lower urinary tract symptoms: Secondary | ICD-10-CM | POA: Diagnosis not present

## 2022-12-17 DIAGNOSIS — L821 Other seborrheic keratosis: Secondary | ICD-10-CM | POA: Diagnosis not present

## 2022-12-17 DIAGNOSIS — D692 Other nonthrombocytopenic purpura: Secondary | ICD-10-CM | POA: Diagnosis not present

## 2022-12-31 DIAGNOSIS — Z03818 Encounter for observation for suspected exposure to other biological agents ruled out: Secondary | ICD-10-CM | POA: Diagnosis not present

## 2023-01-03 ENCOUNTER — Encounter (HOSPITAL_COMMUNITY): Payer: Self-pay | Admitting: *Deleted

## 2023-01-14 DIAGNOSIS — L989 Disorder of the skin and subcutaneous tissue, unspecified: Secondary | ICD-10-CM | POA: Diagnosis not present

## 2023-01-14 DIAGNOSIS — L0390 Cellulitis, unspecified: Secondary | ICD-10-CM | POA: Diagnosis not present

## 2023-01-17 DIAGNOSIS — I872 Venous insufficiency (chronic) (peripheral): Secondary | ICD-10-CM | POA: Diagnosis not present

## 2023-01-17 DIAGNOSIS — L239 Allergic contact dermatitis, unspecified cause: Secondary | ICD-10-CM | POA: Diagnosis not present

## 2023-01-17 DIAGNOSIS — L089 Local infection of the skin and subcutaneous tissue, unspecified: Secondary | ICD-10-CM | POA: Diagnosis not present

## 2023-01-17 DIAGNOSIS — R202 Paresthesia of skin: Secondary | ICD-10-CM | POA: Diagnosis not present

## 2023-02-22 ENCOUNTER — Telehealth: Payer: Self-pay | Admitting: Cardiovascular Disease

## 2023-02-22 NOTE — Telephone Encounter (Signed)
Pt would like a callback regarding what medications he's able to take for his allergies. Please advise

## 2023-02-22 NOTE — Telephone Encounter (Signed)
Left message for patient informing him he may take allergy medication such as Allegra, Zyrtec, or Claritin (or their generic forms). Advised to stay away from medications with decongestants, usually noted with a -D at the end (ex: Allegra-D) as these can cause issues with a-fib or blood pressure. May also use saline nasal spray to help with allergy symptoms as well.  Provided office number for callback if any questions.

## 2023-02-22 NOTE — Telephone Encounter (Signed)
Pt had called to AF clinic as well regarding which medications he can take for allergies with his history/medications. Reviewed with patient and all questions answered. Pt appreciative of advice.

## 2023-03-04 ENCOUNTER — Ambulatory Visit: Payer: Medicare Other | Admitting: Audiologist

## 2023-03-06 DIAGNOSIS — I10 Essential (primary) hypertension: Secondary | ICD-10-CM | POA: Diagnosis not present

## 2023-03-06 DIAGNOSIS — G4719 Other hypersomnia: Secondary | ICD-10-CM | POA: Diagnosis not present

## 2023-03-06 DIAGNOSIS — I4891 Unspecified atrial fibrillation: Secondary | ICD-10-CM | POA: Diagnosis not present

## 2023-03-06 DIAGNOSIS — F411 Generalized anxiety disorder: Secondary | ICD-10-CM | POA: Diagnosis not present

## 2023-03-07 ENCOUNTER — Ambulatory Visit: Payer: Medicare Other | Attending: Audiologist | Admitting: Audiologist

## 2023-03-07 DIAGNOSIS — H903 Sensorineural hearing loss, bilateral: Secondary | ICD-10-CM | POA: Insufficient documentation

## 2023-03-07 DIAGNOSIS — H9313 Tinnitus, bilateral: Secondary | ICD-10-CM | POA: Insufficient documentation

## 2023-03-07 DIAGNOSIS — Z77122 Contact with and (suspected) exposure to noise: Secondary | ICD-10-CM | POA: Diagnosis not present

## 2023-03-07 NOTE — Procedures (Signed)
  Outpatient Audiology and [facility] [address] [place], [place]  [address] [phone]  AUDIOLOGICAL  EVALUATION  NAME: Jeffrey Farrell     DOB:   [birth year]      MRN: [id number]                                                                                     DATE: 03/07/2023     REFERENT: [facility] STATUS: Outpatient DIAGNOSIS: Sensorineural Hearing Loss     History: Ryman was seen for an audiological evaluation.  [name] is receiving a hearing evaluation due to concerns for ringing in both ears that sounds like a high frequency mixed sound. The tinnitus has been getting worse over the last months. [name] has been increasingly stressed due to his mother's passing the last few months. He has had the tinnitus for many years. He worked in [employer], trade [employer] and was regularly exposed to power amplified guitars at concerts. [name] has difficulty hearing in both ears as well. This difficulty began gradually. No pain or pressure reported in either ear. Tinnitus present in both ears.   Evaluation:  Otoscopy showed a clear view of the tympanic membranes, bilaterally Tympanometry results were consistent with normal middle ear function, bilaterally   Audiometric testing was completed using conventional audiometry with supraural high frequency transducer. Speech Recognition Thresholds were  20dB in the right ear and 20dB in the left ear. Word Recognition was performed 40dB SL, scored 92% in the right ear and 92% in the left ear. Pure tone thresholds show normal sloping after 1kHz to moderate severe bilateral sensorineural hearing loss. Tinnitus pitch matched to [facility] pure tone with fair reliability. Patient had 'perfect pitch' and had difficulty pitch matching using a/b choice.    Results:  The test results were reviewed with [name]. 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 His tinnitus was roughly matched to the 4-6kHz  range. He will benefit from hearing aids with programming for tinnitus. He was given a packet of tinnitus information, a copy of his audiogram, and a list of local providers that work with hearing aids.    Recommendations: Amplification is necessary for both ears. Hearing aids can be purchased from a variety of locations. See provided list for locations in the [place] area. Recommend seeing provider with experiences fitting hearing aids on people with tinnitus. Local suggestions provided to patient.    42 minutes spent testing and counseling on results.   [name]  Audiologist, Au.D., CCC-A 03/07/2023  2:42 PM  Cc: [facility]

## 2023-03-12 DIAGNOSIS — F411 Generalized anxiety disorder: Secondary | ICD-10-CM | POA: Diagnosis not present

## 2023-03-12 DIAGNOSIS — F5101 Primary insomnia: Secondary | ICD-10-CM | POA: Diagnosis not present

## 2023-03-12 DIAGNOSIS — F1021 Alcohol dependence, in remission: Secondary | ICD-10-CM | POA: Diagnosis not present

## 2023-03-12 DIAGNOSIS — I10 Essential (primary) hypertension: Secondary | ICD-10-CM | POA: Diagnosis not present

## 2023-03-15 DIAGNOSIS — Z03818 Encounter for observation for suspected exposure to other biological agents ruled out: Secondary | ICD-10-CM | POA: Diagnosis not present

## 2023-03-18 ENCOUNTER — Other Ambulatory Visit: Payer: Self-pay

## 2023-03-18 MED ORDER — METOPROLOL TARTRATE 25 MG PO TABS: ORAL_TABLET | ORAL | 3 refills | Status: DC

## 2023-03-22 DIAGNOSIS — H524 Presbyopia: Secondary | ICD-10-CM | POA: Diagnosis not present

## 2023-04-04 DIAGNOSIS — I10 Essential (primary) hypertension: Secondary | ICD-10-CM | POA: Diagnosis not present

## 2023-04-04 DIAGNOSIS — G4733 Obstructive sleep apnea (adult) (pediatric): Secondary | ICD-10-CM | POA: Diagnosis not present

## 2023-04-04 DIAGNOSIS — I4891 Unspecified atrial fibrillation: Secondary | ICD-10-CM | POA: Diagnosis not present

## 2023-04-04 DIAGNOSIS — F411 Generalized anxiety disorder: Secondary | ICD-10-CM | POA: Diagnosis not present

## 2023-04-29 ENCOUNTER — Other Ambulatory Visit: Payer: Self-pay | Admitting: Internal Medicine

## 2023-04-29 ENCOUNTER — Ambulatory Visit
Admission: RE | Admit: 2023-04-29 | Discharge: 2023-04-29 | Disposition: A | Discharge status: Home / Self Care | Payer: Medicare Other | Source: Ambulatory Visit | Attending: Internal Medicine | Admitting: Internal Medicine

## 2023-04-29 DIAGNOSIS — L820 Inflamed seborrheic keratosis: Secondary | ICD-10-CM | POA: Diagnosis not present

## 2023-04-29 DIAGNOSIS — M79672 Pain in left foot: Secondary | ICD-10-CM | POA: Diagnosis not present

## 2023-04-29 DIAGNOSIS — M2012 Hallux valgus (acquired), left foot: Secondary | ICD-10-CM | POA: Diagnosis not present

## 2023-04-29 DIAGNOSIS — S51801A Unspecified open wound of right forearm, initial encounter: Secondary | ICD-10-CM | POA: Diagnosis not present

## 2023-05-01 DIAGNOSIS — G4733 Obstructive sleep apnea (adult) (pediatric): Secondary | ICD-10-CM | POA: Diagnosis not present

## 2023-05-07 DIAGNOSIS — M1310 Monoarthritis, not elsewhere classified, unspecified site: Secondary | ICD-10-CM | POA: Diagnosis not present

## 2023-05-16 ENCOUNTER — Telehealth: Payer: Self-pay | Admitting: *Deleted

## 2023-05-16 DIAGNOSIS — K219 Gastro-esophageal reflux disease without esophagitis: Secondary | ICD-10-CM | POA: Diagnosis not present

## 2023-05-16 DIAGNOSIS — R1310 Dysphagia, unspecified: Secondary | ICD-10-CM | POA: Diagnosis not present

## 2023-05-16 NOTE — Telephone Encounter (Signed)
   Pre-operative Risk Assessment    Patient Name: Jeffrey Farrell  DOB: 08/17/1956 MRN: [id number]      Request for Surgical Clearance    Procedure:   Colonoscopy/endoscopy  Date of Surgery:  Clearance 06/12/23                                 Surgeon:  Dr. Bosie Clos Surgeon's Group or Practice Name:  [facility] Phone number:  [phone] Fax number:  [phone]   Type of Clearance Requested:   - Medical  - Pharmacy:  Hold Apixaban (Eliquis) Not Indicated   Type of Anesthesia:   Propofol   Additional requests/questions:    Signed, Emmit Pomfret   05/16/2023, 12:35 PM

## 2023-05-17 NOTE — Telephone Encounter (Signed)
Patient with diagnosis of afib on Eliquis for anticoagulation.    Procedure: colonoscopy/endoscopy Date of procedure: 06/12/23  CHA2DS2-VASc Score = 2   This indicates a 2.2% annual risk of stroke. The patient's score is based upon: CHF History: 0 HTN History: 1 Diabetes History: 0 Stroke History: 0 Vascular Disease History: 0 Age Score: 1 Gender Score: 0   CrCl 7mL/min Platelet count 24K  Would confirm if pt is taking Eliquis, med list was marked as him not taking it in Nov of 2023 when he saw Dr Eden Emms last. His note also states "The patient's CHA2DS2-VASc score is 2, turned 66 in April PAF with spontaneous conversion likely due to ETOH abuse If he can stay sober can likely stop anticoagulation." If he is taking Eliquis, he can hold it for 1-2 days prior to procedure.  **This guidance is not considered finalized until pre-operative APP has relayed final recommendations.**

## 2023-05-20 ENCOUNTER — Telehealth: Payer: Self-pay | Admitting: *Deleted

## 2023-05-20 NOTE — Telephone Encounter (Signed)
Pt has been scheduled for tele pre op appt 05/27/23 @ 1:40. Med rec and consent are done.  Pt states he is not taking Eliquis anylonger the Dr. Eden Emms stopped it.     Patient Consent for Virtual Visit        Jeffrey Farrell has provided verbal consent on 05/20/2023 for a virtual visit (video or telephone).   CONSENT FOR VIRTUAL VISIT FOR:  Jeffrey Farrell  By participating in this virtual visit I agree to the following:  I hereby voluntarily request, consent and authorize [facility] and its employed or contracted physicians, physician assistants, nurse practitioners or other licensed health care professionals (the Practitioner), to provide me with telemedicine health care services (the "Services") as deemed necessary by the treating Practitioner. I acknowledge and consent to receive the Services by the Practitioner via telemedicine. I understand that the telemedicine visit will involve communicating with the Practitioner through live audiovisual communication technology and the disclosure of certain medical information by electronic transmission. I acknowledge that I have been given the opportunity to request an in-person assessment or other available alternative prior to the telemedicine visit and am voluntarily participating in the telemedicine visit.  I understand that I have the right to withhold or withdraw my consent to the use of telemedicine in the course of my care at any time, without affecting my right to future care or treatment, and that the Practitioner or I may terminate the telemedicine visit at any time. I understand that I have the right to inspect all information obtained and/or recorded in the course of the telemedicine visit and may receive copies of available information for a reasonable fee.  I understand that some of the potential risks of receiving the Services via telemedicine include:  Delay or interruption in medical evaluation due to technological equipment  failure or disruption; Information transmitted may not be sufficient (e.g. poor resolution of images) to allow for appropriate medical decision making by the Practitioner; and/or  In rare instances, security protocols could fail, causing a breach of personal health information.  Furthermore, I acknowledge that it is my responsibility to provide information about my medical history, conditions and care that is complete and accurate to the best of my ability. I acknowledge that Practitioner's advice, recommendations, and/or decision may be based on factors not within their control, such as incomplete or inaccurate data provided by me or distortions of diagnostic images or specimens that may result from electronic transmissions. I understand that the practice of medicine is not an exact science and that Practitioner makes no warranties or guarantees regarding treatment outcomes. I acknowledge that a copy of this consent can be made available to me via my patient portal [facility] MyChart), or I can request a printed copy by calling the office of [facility].    I understand that my insurance will be billed for this visit.   I have read or had this consent read to me. I understand the contents of this consent, which adequately explains the benefits and risks of the Services being provided via telemedicine.  I have been provided ample opportunity to ask questions regarding this consent and the Services and have had my questions answered to my satisfaction. I give my informed consent for the services to be provided through the use of telemedicine in my medical care

## 2023-05-20 NOTE — Telephone Encounter (Addendum)
Pt has been scheduled for tele pre op appt 05/27/23 @ 1:40. Med rec and consent are done. Pt states he is not taking Eliquis anylonger that Dr. Eden Emms stopped medication.

## 2023-05-20 NOTE — Telephone Encounter (Signed)
   Name: Jeffrey Farrell  DOB: 07-Nov-1956  MRN: [id number]  Primary Cardiologist: Charlton Haws, MD   Preoperative team, please contact this patient and set up a phone call appointment for further preoperative risk assessment. Please obtain consent and complete medication review. Thank you for your help.  I confirm that guidance regarding antiplatelet and oral anticoagulation therapy has been completed and, if necessary, noted below.  Pharmacy has reviewed anticoagulation.   Ronney Asters, NP 05/20/2023, 7:31 AM [facility]

## 2023-05-20 NOTE — Addendum Note (Signed)
Addended by: Tarri Fuller on: 05/20/2023 08:50 AM   Modules accepted: Orders

## 2023-05-27 ENCOUNTER — Ambulatory Visit: Payer: Medicare Other | Attending: Internal Medicine

## 2023-05-27 ENCOUNTER — Telehealth: Payer: Self-pay | Admitting: *Deleted

## 2023-05-27 DIAGNOSIS — Z01810 Encounter for preprocedural cardiovascular examination: Secondary | ICD-10-CM

## 2023-05-27 NOTE — Telephone Encounter (Signed)
Pt called as he thought his appt was in office and he would have to change the appt. I explained to the pt that this appt today is just a tele pre op appt. Pt said ok then no need to change the appt. Pt thanked me taking the call and helping him.

## 2023-05-27 NOTE — Progress Notes (Signed)
Virtual Visit via Telephone Note   Because of Jeffrey Farrell's co-morbid illnesses, he is at least at moderate risk for complications without adequate follow up.  This format is felt to be most appropriate for this patient at this time.  The patient did not have access to video technology/had technical difficulties with video requiring transitioning to audio format only (telephone).  All issues noted in this document were discussed and addressed.  No physical exam could be performed with this format.  Please refer to the patient's chart for his consent to telehealth for [facility].  Evaluation Performed:  Preoperative cardiovascular risk assessment _____________   Date:  05/27/2023   Patient ID:  Jeffrey Farrell, DOB 30-Jan-1956, MRN [id number] Patient Location:  Home Provider location:   Office  Primary Care Provider:  [facility] Primary Cardiologist:  Charlton Haws, MD  Chief Complaint / Patient Profile   67 y.o. y/o male with a h/o CAD, HTN, AF, EtOH abuse, fatty liver disease who is pending colonoscopy/EGD and presents today for telephonic preoperative cardiovascular risk assessment.  History of Present Illness    Jeffrey Farrell is a 67 y.o. male who presents via audio/video conferencing for a telehealth visit today.  Pt was last seen in cardiology clinic on 10/05/2022 by Dr. Eden Emms.  At that time HUU DOLL was doing well with no symptoms of palpitations, chest pain, or shortness of breath. Described unintentional weight loss of 30 pounds the patient is now pending procedure as outlined above. Since his last visit, he has been doing well and is currently no longer drinking alcohol.  He has been off of his blood thinner for greater than 6 months.  I advised him to follow-up with Dr. Eden Emms to see if he would prefer him to restart his Eliquis now that he is no longer drinking.  He reports overall doing well with no new cardiac  complaints.  Past Medical History    Past Medical History:  Diagnosis Date   Alcohol abuse    Asthma    Atrial fibrillation (HCC)    Enlarged prostate    Fatty liver    Hypertension    Thyroid disease    Past Surgical History:  Procedure Laterality Date   TONSILLECTOMY     TOTAL HIP ARTHROPLASTY Bilateral    VARICOSE VEIN SURGERY     x 2-3    Allergies  No Known Allergies  Home Medications    Prior to Admission medications   Medication Sig Start Date End Date Taking? Authorizing Provider  apixaban (ELIQUIS) 5 MG TABS tablet Take 1 tablet by mouth twice daily Patient not taking: Reported on 10/05/2022 12/08/21   Fenton, Clint R, PA  fexofenadine (ALLEGRA) 60 MG tablet Take 60 mg by mouth daily. Patient not taking: Reported on 10/05/2022    [provider]  folic acid (FOLVITE) 1 MG tablet Take 1 tablet (1 mg total) by mouth daily. 11/10/21   Celedonio Savage, MD  levothyroxine (SYNTHROID) 150 MCG tablet Take 1 tablet (150 mcg total) by mouth daily. 11/10/21   Celedonio Savage, MD  methylPREDNISolone (MEDROL DOSEPAK) 4 MG TBPK tablet as directed. As directed 05/08/23   [provider]  metoprolol tartrate (LOPRESSOR) 25 MG tablet TAKE 1/2 (ONE-HALF) TABLET BY MOUTH IN THE MORNING AND 1 IN THE EVENING 03/18/23   Wendall Stade, MD  montelukast (SINGULAIR) 10 MG tablet Take 10 mg by mouth at bedtime.    [provider]  pantoprazole (PROTONIX) 40 MG tablet Take 1 tablet (40 mg total) by mouth daily. 12/29/21   Armbruster, Willaim Rayas, MD  tadalafil (CIALIS) 5 MG tablet Take 5 mg by mouth daily as needed for erectile dysfunction.    [provider]  tamsulosin (FLOMAX) 0.4 MG CAPS capsule Take 1 capsule (0.4 mg total) by mouth daily. 11/10/21   Cresenzo, Cyndi Lennert, MD  temazepam (RESTORIL) 30 MG capsule Take 30 mg by mouth at bedtime. 10/31/21   [provider]  thiamine 100 MG tablet Take 1 tablet (100 mg total) by mouth daily. 11/10/21    Celedonio Savage, MD  triamcinolone cream (KENALOG) 0.1 % Apply 1 application topically as needed.    [provider]    Physical Exam    Vital Signs:  DAMARIE BREIG does not have vital signs available for review today.  Given telephonic nature of communication, physical exam is limited. AAOx3. NAD. Normal affect.  Speech and respirations are unlabored.  Accessory Clinical Findings    None  Assessment & Plan    1.  Preoperative Cardiovascular Risk Assessment:  Patient's RCRI score is 0.4% The patient affirms he has been doing well without any new cardiac symptoms. They are able to achieve 5 METS without cardiac limitations. Therefore, based on ACC/AHA guidelines, the patient would be at acceptable risk for the planned procedure without further cardiovascular testing. The patient was advised that if he develops new symptoms prior to surgery to contact our office to arrange for a follow-up visit, and he verbalized understanding.    The patient was advised that if he develops new symptoms prior to surgery to contact our office to arrange for a follow-up visit, and he verbalized understanding.  Patient reports not being on Eliquis for greater than 6 months.  He was advised to abstain until following his procedure and should follow-up with primary cardiology regarding resuming medication.  A copy of this note will be routed to requesting surgeon.  Time:   Today, I have spent 10 minutes with the patient with telehealth technology discussing medical history, symptoms, and management plan.     Napoleon Form, Leodis Rains, NP  05/27/2023, 7:23 AM

## 2023-05-31 DIAGNOSIS — G4733 Obstructive sleep apnea (adult) (pediatric): Secondary | ICD-10-CM | POA: Diagnosis not present

## 2023-06-06 DIAGNOSIS — L821 Other seborrheic keratosis: Secondary | ICD-10-CM | POA: Diagnosis not present

## 2023-06-06 DIAGNOSIS — D492 Neoplasm of unspecified behavior of bone, soft tissue, and skin: Secondary | ICD-10-CM | POA: Diagnosis not present

## 2023-06-06 DIAGNOSIS — C44529 Squamous cell carcinoma of skin of other part of trunk: Secondary | ICD-10-CM | POA: Diagnosis not present

## 2023-06-06 DIAGNOSIS — L814 Other melanin hyperpigmentation: Secondary | ICD-10-CM | POA: Diagnosis not present

## 2023-06-06 DIAGNOSIS — D225 Melanocytic nevi of trunk: Secondary | ICD-10-CM | POA: Diagnosis not present

## 2023-06-06 DIAGNOSIS — R233 Spontaneous ecchymoses: Secondary | ICD-10-CM | POA: Diagnosis not present

## 2023-06-10 ENCOUNTER — Ambulatory Visit (HOSPITAL_COMMUNITY)
Admission: RE | Admit: 2023-06-10 | Discharge: 2023-06-10 | Disposition: A | Discharge status: Home / Self Care | Payer: Medicare Other | Source: Ambulatory Visit | Attending: Internal Medicine | Admitting: Internal Medicine

## 2023-06-10 ENCOUNTER — Other Ambulatory Visit (HOSPITAL_COMMUNITY): Payer: Self-pay | Admitting: Internal Medicine

## 2023-06-10 DIAGNOSIS — M7989 Other specified soft tissue disorders: Secondary | ICD-10-CM | POA: Insufficient documentation

## 2023-06-10 DIAGNOSIS — M79672 Pain in left foot: Secondary | ICD-10-CM | POA: Diagnosis not present

## 2023-06-10 DIAGNOSIS — D692 Other nonthrombocytopenic purpura: Secondary | ICD-10-CM | POA: Diagnosis not present

## 2023-06-20 ENCOUNTER — Other Ambulatory Visit: Payer: Self-pay | Admitting: Podiatry

## 2023-06-20 ENCOUNTER — Encounter: Payer: Self-pay | Admitting: Podiatry

## 2023-06-20 ENCOUNTER — Ambulatory Visit: Payer: Medicare Other | Admitting: Podiatry

## 2023-06-20 DIAGNOSIS — M19072 Primary osteoarthritis, left ankle and foot: Secondary | ICD-10-CM

## 2023-06-20 DIAGNOSIS — M79672 Pain in left foot: Secondary | ICD-10-CM

## 2023-06-20 MED ORDER — TRIAMCINOLONE ACETONIDE 10 MG/ML IJ SUSP
2.5000 mg | Freq: Once | INTRAMUSCULAR | Status: AC
Start: 2023-06-20 — End: 2023-06-20
  Administered 2023-06-20: 2.5 mg via INTRA_ARTICULAR

## 2023-06-20 MED ORDER — DEXAMETHASONE SODIUM PHOSPHATE 120 MG/30ML IJ SOLN
4.0000 mg | Freq: Once | INTRAMUSCULAR | Status: AC
Start: 2023-06-20 — End: 2023-06-20
  Administered 2023-06-20: 4 mg via INTRA_ARTICULAR

## 2023-06-20 NOTE — Progress Notes (Signed)
  Subjective:  Patient ID: Jeffrey Farrell, male    DOB: 1956/08/14,   MRN: [id number]  Chief Complaint  Patient presents with   Foot Pain    new pt-L foot pain, swelling persistent over 1 month.    67 y.o. male presents for concern of left foot pain that has been going on for about a month. Relates swelling and pain running to his great toe starting from the middle part of his foot. Relates a history of vascular issues and concerned about the swelling. Has had steroid packs to help and relieved symptoms but returned. Relates doing a lot of walking in cheap shoes and seems to be how this started.  . Denies any other pedal complaints. Denies n/v/f/c.   Past Medical History:  Diagnosis Date   Alcohol abuse    Asthma    Atrial fibrillation (HCC)    Enlarged prostate    Fatty liver    Hypertension    Thyroid disease     Objective:  Physical Exam: Vascular: DP/PT pulses 2/4 bilateral. CFT <3 seconds. Normal hair growth on digits. Mild edema noted to left lower extremity.   Skin. No lacerations or abrasions bilateral feet.  Musculoskeletal: MMT 5/5 bilateral lower extremities in DF, PF, Inversion and Eversion. Deceased ROM in DF of ankle joint. Tender to palpation over the midfoot area particularly over the second and third TMTJ area. No pain around ball of foot or first MPJ. No pain with ROM of the first MPJ lesser MPJ. HAV deformity noted with hammered digits.  Neurological: Sensation intact to light touch.   Assessment:   1. Arthritis of left midfoot      Plan:  Patient was evaluated and treated and all questions answered. Discussed midfoot arthritis with patient and treatment options.  X-rays reviewed. No acute fractures or dislocations. Degenerative changes noted throughout midfoot. HAV deformity noted and some degeneartive changes to the first MPJ.  Discussed NSAIDS, topicals, and possible injections.  Unable to take NSAIDs so opted for injection. Procedure below.  Discussed  stiff soled shoes and inserts.  Discussed if pain does not improve can discuss surgical options.  Patient to follow-up as needed.   Procedure: Injection Tendon/Ligament Discussed alternatives, risks, complications and verbal consent was obtained.  Location: Left second and third TMTJ. Skin Prep: Alcohol. Injectate: 1cc 0.5% marcaine plain, 1 cc dexamethasone 0.5 cc kenalog  Disposition: Patient tolerated procedure well. Injection site dressed with a band-aid.  Post-injection care was discussed and return precautions discussed.    Louann Sjogren, DPM

## 2023-06-21 ENCOUNTER — Encounter: Payer: Self-pay | Admitting: Podiatry

## 2023-07-01 DIAGNOSIS — G4733 Obstructive sleep apnea (adult) (pediatric): Secondary | ICD-10-CM | POA: Diagnosis not present

## 2023-07-25 DIAGNOSIS — R3912 Poor urinary stream: Secondary | ICD-10-CM | POA: Diagnosis not present

## 2023-07-25 DIAGNOSIS — M1711 Unilateral primary osteoarthritis, right knee: Secondary | ICD-10-CM | POA: Diagnosis not present

## 2023-07-25 DIAGNOSIS — M79672 Pain in left foot: Secondary | ICD-10-CM | POA: Diagnosis not present

## 2023-07-25 DIAGNOSIS — N529 Male erectile dysfunction, unspecified: Secondary | ICD-10-CM | POA: Diagnosis not present

## 2023-07-25 DIAGNOSIS — N401 Enlarged prostate with lower urinary tract symptoms: Secondary | ICD-10-CM | POA: Diagnosis not present

## 2023-07-25 DIAGNOSIS — R350 Frequency of micturition: Secondary | ICD-10-CM | POA: Diagnosis not present

## 2023-08-12 DIAGNOSIS — L821 Other seborrheic keratosis: Secondary | ICD-10-CM | POA: Diagnosis not present

## 2023-08-12 DIAGNOSIS — D492 Neoplasm of unspecified behavior of bone, soft tissue, and skin: Secondary | ICD-10-CM | POA: Diagnosis not present

## 2023-08-12 DIAGNOSIS — Z85828 Personal history of other malignant neoplasm of skin: Secondary | ICD-10-CM | POA: Diagnosis not present

## 2023-08-12 DIAGNOSIS — L905 Scar conditions and fibrosis of skin: Secondary | ICD-10-CM | POA: Diagnosis not present

## 2023-08-14 DIAGNOSIS — G4733 Obstructive sleep apnea (adult) (pediatric): Secondary | ICD-10-CM | POA: Diagnosis not present

## 2023-09-04 DIAGNOSIS — I10 Essential (primary) hypertension: Secondary | ICD-10-CM | POA: Diagnosis not present

## 2023-09-04 DIAGNOSIS — I4891 Unspecified atrial fibrillation: Secondary | ICD-10-CM | POA: Diagnosis not present

## 2023-09-04 DIAGNOSIS — F411 Generalized anxiety disorder: Secondary | ICD-10-CM | POA: Diagnosis not present

## 2023-09-04 DIAGNOSIS — G4733 Obstructive sleep apnea (adult) (pediatric): Secondary | ICD-10-CM | POA: Diagnosis not present

## 2023-09-13 DIAGNOSIS — M1711 Unilateral primary osteoarthritis, right knee: Secondary | ICD-10-CM | POA: Diagnosis not present

## 2023-09-13 DIAGNOSIS — G4733 Obstructive sleep apnea (adult) (pediatric): Secondary | ICD-10-CM | POA: Diagnosis not present

## 2023-09-13 DIAGNOSIS — M79672 Pain in left foot: Secondary | ICD-10-CM | POA: Diagnosis not present

## 2023-09-16 DIAGNOSIS — I10 Essential (primary) hypertension: Secondary | ICD-10-CM | POA: Diagnosis not present

## 2023-09-16 DIAGNOSIS — Z0000 Encounter for general adult medical examination without abnormal findings: Secondary | ICD-10-CM | POA: Diagnosis not present

## 2023-09-16 DIAGNOSIS — E039 Hypothyroidism, unspecified: Secondary | ICD-10-CM | POA: Diagnosis not present

## 2023-09-16 DIAGNOSIS — R739 Hyperglycemia, unspecified: Secondary | ICD-10-CM | POA: Diagnosis not present

## 2023-09-16 DIAGNOSIS — Z125 Encounter for screening for malignant neoplasm of prostate: Secondary | ICD-10-CM | POA: Diagnosis not present

## 2023-09-16 DIAGNOSIS — G4733 Obstructive sleep apnea (adult) (pediatric): Secondary | ICD-10-CM | POA: Diagnosis not present

## 2023-09-16 DIAGNOSIS — F411 Generalized anxiety disorder: Secondary | ICD-10-CM | POA: Diagnosis not present

## 2023-09-26 DIAGNOSIS — L300 Nummular dermatitis: Secondary | ICD-10-CM | POA: Diagnosis not present

## 2023-09-26 DIAGNOSIS — L271 Localized skin eruption due to drugs and medicaments taken internally: Secondary | ICD-10-CM | POA: Diagnosis not present

## 2023-09-26 DIAGNOSIS — Z08 Encounter for follow-up examination after completed treatment for malignant neoplasm: Secondary | ICD-10-CM | POA: Diagnosis not present

## 2023-09-26 DIAGNOSIS — D492 Neoplasm of unspecified behavior of bone, soft tissue, and skin: Secondary | ICD-10-CM | POA: Diagnosis not present

## 2023-09-26 DIAGNOSIS — L538 Other specified erythematous conditions: Secondary | ICD-10-CM | POA: Diagnosis not present

## 2023-09-26 DIAGNOSIS — Z85828 Personal history of other malignant neoplasm of skin: Secondary | ICD-10-CM | POA: Diagnosis not present

## 2023-10-07 ENCOUNTER — Encounter: Payer: Self-pay | Admitting: Cardiology

## 2023-10-07 ENCOUNTER — Ambulatory Visit: Payer: Medicare Other | Attending: Cardiology

## 2023-10-07 ENCOUNTER — Ambulatory Visit: Payer: Medicare Other | Attending: Cardiology | Admitting: Cardiology

## 2023-10-07 VITALS — BP 108/66 | HR 66 | Resp 16 | Ht 72.0 in | Wt 206.6 lb

## 2023-10-07 DIAGNOSIS — R011 Cardiac murmur, unspecified: Secondary | ICD-10-CM | POA: Diagnosis not present

## 2023-10-07 DIAGNOSIS — I480 Paroxysmal atrial fibrillation: Secondary | ICD-10-CM

## 2023-10-07 DIAGNOSIS — I700 Atherosclerosis of aorta: Secondary | ICD-10-CM

## 2023-10-07 DIAGNOSIS — I10 Essential (primary) hypertension: Secondary | ICD-10-CM

## 2023-10-07 MED ORDER — APIXABAN 5 MG PO TABS: 5.0000 mg | ORAL_TABLET | Freq: Two times a day (BID) | ORAL | 3 refills | Status: DC

## 2023-10-07 NOTE — Progress Notes (Signed)
Cardiology Office Note:   Date:  10/07/2023  ID:  Jeffrey Farrell, DOB Dec 13, 1955, MRN [id number] PCP: Collene Mares, PA  [facility] Providers Cardiologist:  Charlton Haws, MD    History of Present Illness:   Discussed the use of AI scribe software for clinical note transcription with the patient, who gave verbal consent to proceed.  History of Present Illness   The patient is a 67 year old individual with a history of hypertension, alcohol use, fatty liver disease, and atrial fibrillation. The atrial fibrillation was initially diagnosed in December 2022 following an episode of chest pain and shortness of breath after heavy alcohol consumption and overuse of Viagra. The patient experienced spontaneous conversion to normal sinus rhythm during hospitalization. A two-week heart monitor following this episode showed no atrial fibrillation.  Recently, the patient reported an unusual sensation described as 'air bubbly' in the stomach and chest, without associated pain. This was accompanied by an irregular pulse. The patient has been monitoring his pulse using an oximeter, which typically ranges from 56 to 85 beats per minute. The patient is currently on metoprolol tartrate 25mg , taken as a whole tablet in the morning and half in the evening.  The patient also reported taking Tadalafil, prescribed by a urologist for an enlarged prostate, and occasionally takes an additional dose for sexual activity. The patient denied any recent chest discomfort, shortness of breath, or symptoms reminiscent of his previous atrial fibrillation episode. However, he reported some swelling in one foot and varicosities, which have been present for years. The patient also mentioned a history of orthopedic issues, including a right knee that is 'bone on bone' and an arthritic left foot.  The patient occasionally experiences dizziness and lightheadedness, but not to a debilitating extent. He denied any shortness of  breath at night or problems laying flat. The patient has been diagnosed with sleep apnea but has not been able to use a CPAP machine due to discomfort from the mask and broken teeth.  The patient has not been taking Eliquis, an anticoagulant previously prescribed for atrial fibrillation. Previous notes from cardiology indicate consideration of stopping OAC if he were to abstain from alcohol.   The patient also reported a lack of exercise due to orthopedic issues and a diet that includes 'a lot of bad stuff.' Despite this, his cholesterol levels have remained within normal limits.  The patient recently moved to an apartment in [facility] and expressed a desire to continue receiving care with [employer].      Studies Reviewed:    EKG:   EKG Interpretation Date/Time:  Monday October 07 2023 13:19:49 EST Ventricular Rate:  56 PR Interval:  208 QRS Duration:  84 QT Interval:  386 QTC Calculation: 372 R Axis:   -12  Text Interpretation: Sinus bradycardia When compared with ECG of 08-Dec-2021 11:40, No significant change was found Confirmed by Perlie Gold [phone]) on 10/07/2023 1:29:32 PM    Risk Assessment/Calculations:    CHA2DS2-VASc Score = 3   This indicates a 3.2% annual risk of stroke. The patient's score is based upon: CHF History: 0 HTN History: 1 Diabetes History: 0 Stroke History: 0 Vascular Disease History: 1 Age Score: 1 Gender Score: 0             Physical Exam:   VS:  BP 108/66 (BP Location: Left Arm, Patient Position: Sitting, Cuff Size: Normal)   Pulse 66   Resp 16   Ht 6' (1.829 m)   Wt 206 lb  9.6 oz (93.7 kg)   SpO2 98%   BMI 28.02 kg/m    Wt Readings from Last 3 Encounters:  10/07/23 206 lb 9.6 oz (93.7 kg)  10/05/22 178 lb 9.6 oz (81 kg)  03/29/22 199 lb 9.6 oz (90.5 kg)     Physical Exam Vitals reviewed.  Constitutional:      Appearance: Normal appearance.  HENT:     Head: Normocephalic.  Eyes:     Pupils: Pupils are equal, round,  and reactive to light.  Cardiovascular:     Rate and Rhythm: Normal rate and regular rhythm.     Pulses: Normal pulses.     Heart sounds: Murmur (RUSB) heard.  Pulmonary:     Effort: Pulmonary effort is normal.     Breath sounds: Normal breath sounds.  Abdominal:     General: Abdomen is flat.     Palpations: Abdomen is soft.  Musculoskeletal:     Right lower leg: No edema.     Left lower leg: No edema.  Skin:    General: Skin is warm and dry.     Capillary Refill: Capillary refill takes less than 2 seconds.  Neurological:     General: No focal deficit present.     Mental Status: He is alert and oriented to person, place, and time.  Psychiatric:        Mood and Affect: Mood normal.        Behavior: Behavior normal.        Thought Content: Thought content normal.        Judgment: Judgment normal.     Physical Exam   VITALS: BP- 108/65, P- 59-85 CARDIOVASCULAR: Systolic murmur auscultated, suggestive of aortic valve calcification.       ASSESSMENT AND PLAN:     Assessment and Plan    Atrial Fibrillation Initially diagnosed in December 2022. Recent episode of abnormal heart rhythm, likely AFib. Risk factors include untreated sleep apnea and heavy alcohol use. CHA2DS2-VASc score of 3, indicating a 3.2% annual stroke risk. Discussed stroke risks and benefits of anticoagulation therapy. Eliquis is generally well-tolerated and safer than warfarin. Emphasized monitoring for AFib recurrence and managing sleep apnea. - Restart Eliquis 5 mg BID - Order 2-week heart monitor - Follow up with sleep specialist regarding CPAP - Schedule echocardiogram within 1-2 months  Sleep Apnea Diagnosed but unable to tolerate CPAP due to mask fit issues and broken teeth. Significant risk factor for recurrent AFib. Discussed importance of treating sleep apnea to reduce AFib risk. - Follow up with sleep specialist to find a suitable CPAP mask  Systolic Heart Murmur Newly detected systolic murmur,  suspected due to aortic valve calcification. Potential for aortic stenosis, requires further evaluation. - Order echocardiogram to take place in next 1-2 months  Aortic Atherosclerosis Presence of aortic plaque noted on previous CT scan. Contributes to CHA2DS2-VASc score (doesn't appear to have been previously counted in patient's scoring). Cholesterol levels excellent with recent lipid panel showing 72 mg/dL. No re-evaluation needed at this time.  Hypertension Well-controlled on metoprolol tartrate 25 mg AM and 12.5 mg PM. Blood pressure 108/65 mmHg. - Continue current metoprolol regimen   Follow-up - Follow up in 3 months - Check kidney function and CBC before starting Eliquis.            Signed, Perlie Gold, PA-C

## 2023-10-07 NOTE — Progress Notes (Unsigned)
Enrolled for [employer] to mail a ZIO XT long term holter monitor to the patients address on file.   Dr. Nishan to read. 

## 2023-10-07 NOTE — Patient Instructions (Addendum)
Medication Instructions:  Your physician has recommended you make the following change in your medication:   START Eliquis 5 mg twice daily  *If you need a refill on your cardiac medications before your next appointment, please call your pharmacy*  Lab Work: BMP and CBC TODAY  If you have labs (blood work) drawn today and your tests are completely normal, you will receive your results only by: MyChart Message (if you have MyChart) OR A paper copy in the mail If you have any lab test that is abnormal or we need to change your treatment, we will call you to review the results.  Testing/Procedures: Your physician has requested that you wear a Zio heart monitor for 14 days. This will be mailed to your home with instructions on how to apply the monitor and how to return it when finished. Please allow 2 weeks after returning the heart monitor before our office calls you with the results.   Your physician has requested that you have an echocardiogram. Echocardiography is a painless test that uses sound waves to create images of your heart. It provides your doctor with information about the size and shape of your heart and how well your heart's chambers and valves are working. This procedure takes approximately one hour. There are no restrictions for this procedure. Please do NOT wear cologne, perfume, aftershave, or lotions (deodorant is allowed). Please arrive 15 minutes prior to your appointment time.  Please note: We ask at that you not bring children with you during ultrasound (echo/ vascular) testing. Due to room size and safety concerns, children are not allowed in the ultrasound rooms during exams. Our front office staff cannot provide observation of children in our lobby area while testing is being conducted. An adult accompanying a patient to their appointment will only be allowed in the ultrasound room at the discretion of the ultrasound technician under special circumstances. We apologize  for any inconvenience.   Follow-Up: At [facility], you and your health needs are our priority.  As part of our continuing mission to provide you with exceptional heart care, we have created designated Provider Care Teams.  These Care Teams include your primary Cardiologist (physician) and Advanced Practice Providers (APPs -  Physician Assistants and Nurse Practitioners) who all work together to provide you with the care you need, when you need it.  Your next appointment:   3 month(s)  The format for your next appointment:   In Person  Provider:   Perlie Gold, PA-C  Other Instructions ZIO XT- Long Term Monitor Instructions     Your physician has requested you wear a ZIO patch monitor for 14 days.  This is a single patch monitor. [employer] supplies one patch monitor per enrollment. Additional  stickers are not available. Please do not apply patch if you will be having a Nuclear Stress Test,  Echocardiogram, Cardiac CT, MRI, or Chest Xray during the period you would be wearing the  monitor. The patch cannot be worn during these tests. You cannot remove and re-apply the  ZIO XT patch monitor.  Once you have received your monitor, please review the enclosed instructions. Your monitor  has already been registered assigning a specific monitor serial # to you.     Billing and Patient Assistance Program Information     We have supplied [employer] with any of your insurance information on file for billing purposes.  [employer] offers a sliding scale Patient Assistance Program for patients that do not have  insurance, or  whose insurance does not completely cover the cost of the ZIO monitor.  You must apply for the Patient Assistance Program to qualify for this discounted rate.  To apply, please call [employer] at [phone], select option 4, select option 2, ask to apply for  Patient Assistance Program. Meredeth Ide will ask your household income, and how many people  are in your household. They will  quote your out-of-pocket cost based on that information.  [employer] will also be able to set up a 105-month, interest-free payment plan if needed.     Applying the monitor     Shave hair from upper left chest.  Hold abrader disc by orange tab. Rub abrader in 40 strokes over the upper left chest as  indicated in your monitor instructions.  Clean area with 4 enclosed alcohol pads. Let dry.  Apply patch as indicated in monitor instructions. Patch will be placed under collarbone on left  side of chest with arrow pointing upward.  Rub patch adhesive wings for 2 minutes. Remove white label marked "1". Remove the white  label marked "2". Rub patch adhesive wings for 2 additional minutes.  While looking in a mirror, press and release button in center of patch. A small green light will  flash 3-4 times. This will be your only indicator that the monitor has been turned on.  Do not shower for the first 24 hours. You may shower after the first 24 hours.  Press the button if you feel a symptom. You will hear a small click. Record Date, Time and  Symptom in the Patient Logbook.  When you are ready to remove the patch, follow instructions on the last 2 pages of Patient  Logbook. Stick patch monitor onto the last page of Patient Logbook.  Place Patient Logbook in the blue and white box. Use locking tab on box and tape box closed  securely. The blue and white box has prepaid postage on it. Please place it in the mailbox as  soon as possible. Your physician should have your test results approximately 7 days after the  monitor has been mailed back to [facility].  Call [facility] Customer Care at [phone] if you have questions regarding  your ZIO XT patch monitor. Call them immediately if you see an orange light blinking on your  monitor.  If your monitor falls off in less than 4 days, contact our Monitor department at [phone].  If your monitor becomes loose or falls off after 4 days call  [employer] at [phone] for  suggestions on securing your monitor.

## 2023-10-08 LAB — BASIC METABOLIC PANEL
BUN/Creatinine Ratio: 12 (ref 10–24)
BUN: 11 mg/dL (ref 8–27)
CO2: 25 mmol/L (ref 20–29)
Calcium: 9.4 mg/dL (ref 8.6–10.2)
Chloride: 100 mmol/L (ref 96–106)
Creatinine, Ser: 0.89 mg/dL (ref 0.76–1.27)
Glucose: 82 mg/dL (ref 70–99)
Potassium: 5 mmol/L (ref 3.5–5.2)
Sodium: 138 mmol/L (ref 134–144)
eGFR: 95 mL/min/{1.73_m2} (ref >59)

## 2023-10-08 LAB — CBC
Hematocrit: 41 % (ref 37.5–51.0)
Hemoglobin: 13.7 g/dL (ref 13.0–17.7)
MCH: 30.5 pg (ref 26.6–33.0)
MCHC: 33.4 g/dL (ref 31.5–35.7)
MCV: 91 fL (ref 79–97)
Platelets: 281 10*3/uL (ref 150–450)
RBC: 4.49 x10E6/uL (ref 4.14–5.80)
RDW: 11.9 % (ref 11.6–15.4)
WBC: 5.7 10*3/uL (ref 3.4–10.8)

## 2023-10-14 DIAGNOSIS — G4733 Obstructive sleep apnea (adult) (pediatric): Secondary | ICD-10-CM | POA: Diagnosis not present

## 2023-10-15 ENCOUNTER — Telehealth: Payer: Self-pay | Admitting: *Deleted

## 2023-10-15 NOTE — Telephone Encounter (Signed)
Patient accidentally applied ZIO XT monitor to the wrong side of his chest. Murriel Hopper at [facility] called to cancel charges on [phone] and send the patient a replacement monitor.

## 2023-10-17 DIAGNOSIS — G4733 Obstructive sleep apnea (adult) (pediatric): Secondary | ICD-10-CM | POA: Diagnosis not present

## 2023-10-18 IMAGING — CT CT ABD-PELV W/O CM
2 of 4 series · 17 of 46 positions shown, 19 images · non-contrast
Comparison: None.

CLINICAL DATA: Acute generalized abdominal pain, nausea, vomiting.

EXAM:
CT ABDOMEN AND PELVIS WITHOUT CONTRAST
TECHNIQUE: Multidetector CT imaging of the abdomen and pelvis was performed
following the standard protocol without IV contrast.

[Series 3: a/p w/o 5mm · axial · non-contrast · 0.98mm/px · z∈[+884,+1314]mm · 14 of 94 slices shown, 16 images]
[im 4/94  soft-tissue]
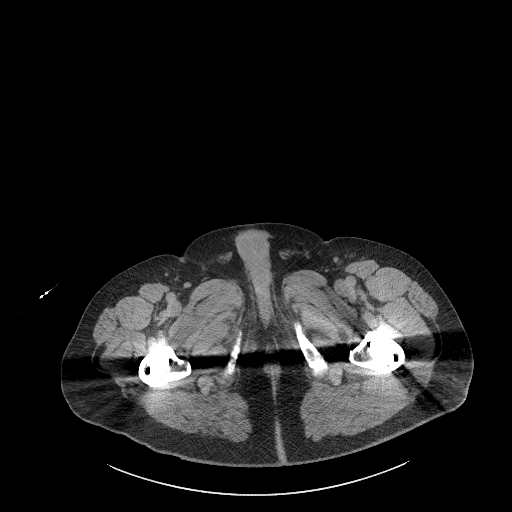
[im 4/94  bone]
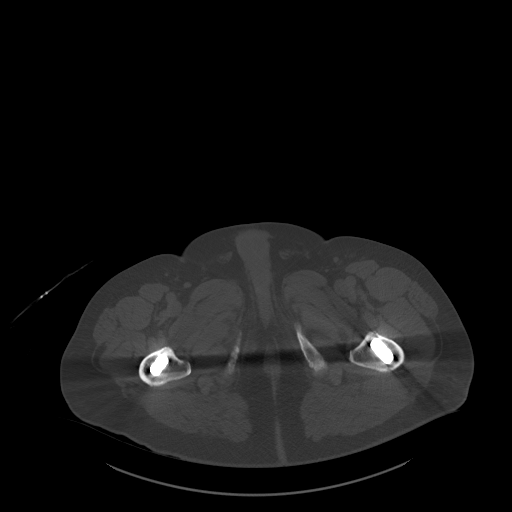
[im 12/94  soft-tissue]
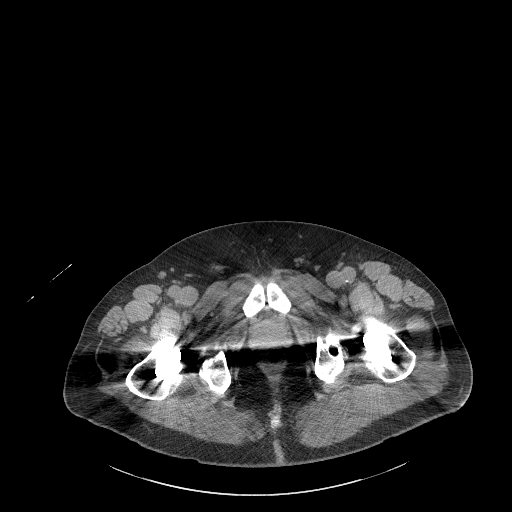
[im 20/94  soft-tissue]
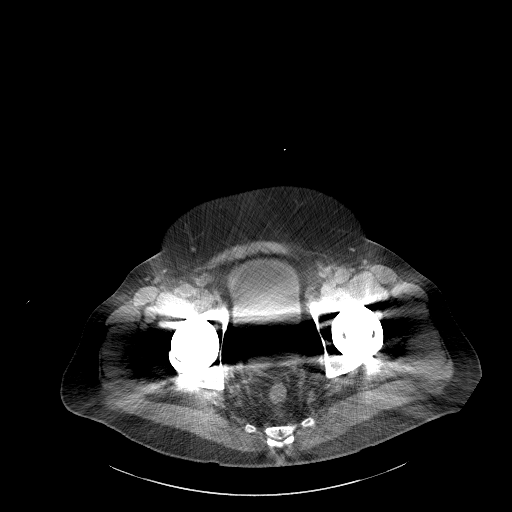
[im 24/94  soft-tissue]
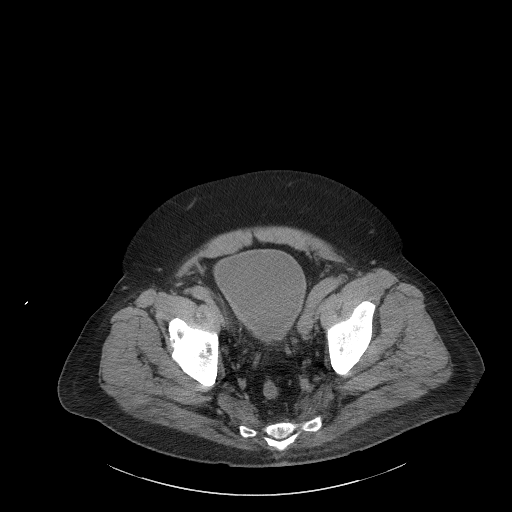
[im 32/94  soft-tissue]
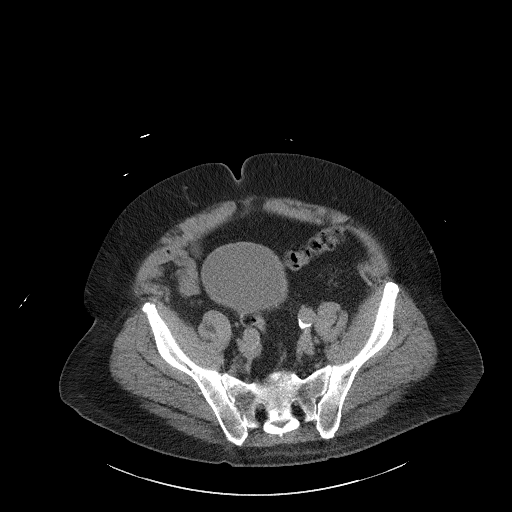
[im 39/94  soft-tissue]
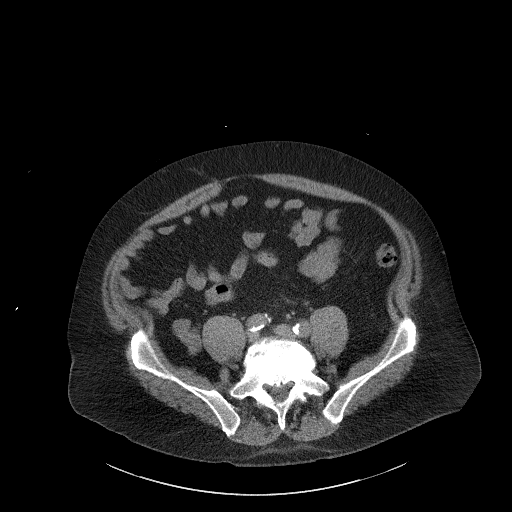
[im 43/94  soft-tissue]
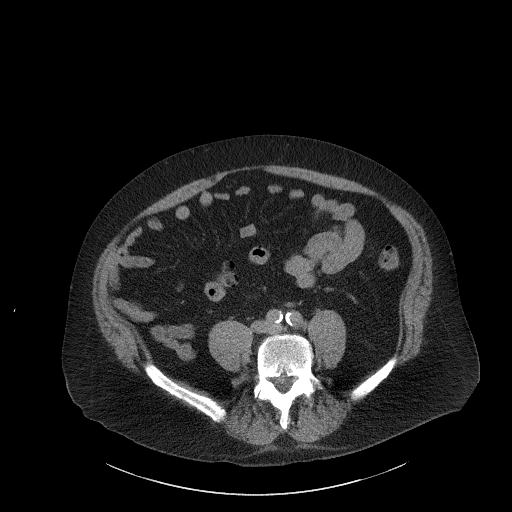
[im 51/94  soft-tissue]
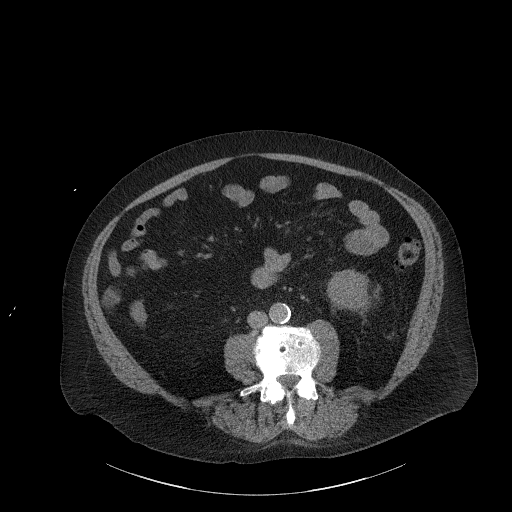
[im 55/94  soft-tissue]
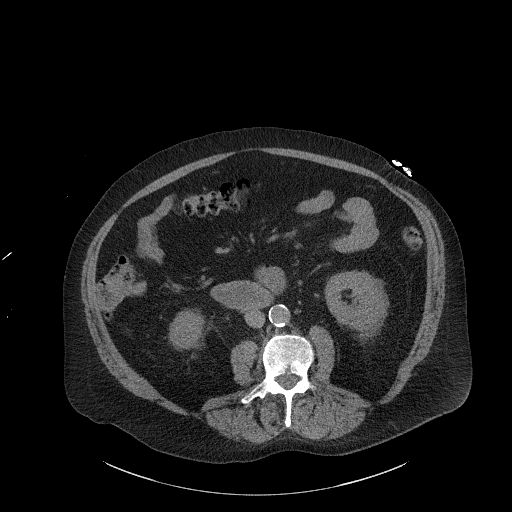
[im 55/94  bone]
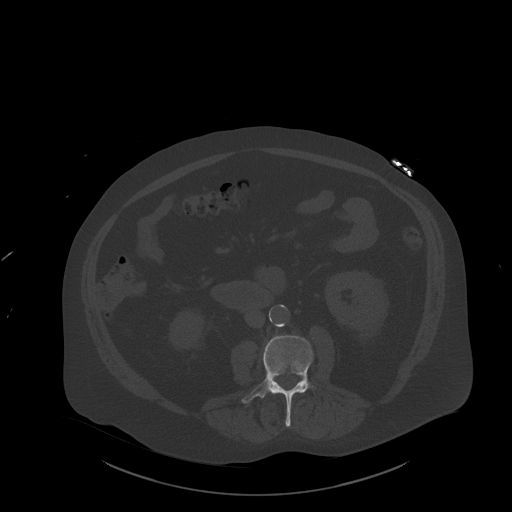
[im 63/94  soft-tissue]
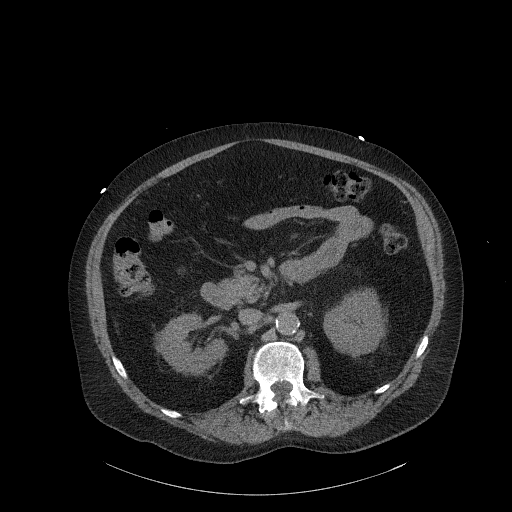
[im 70/94  soft-tissue]
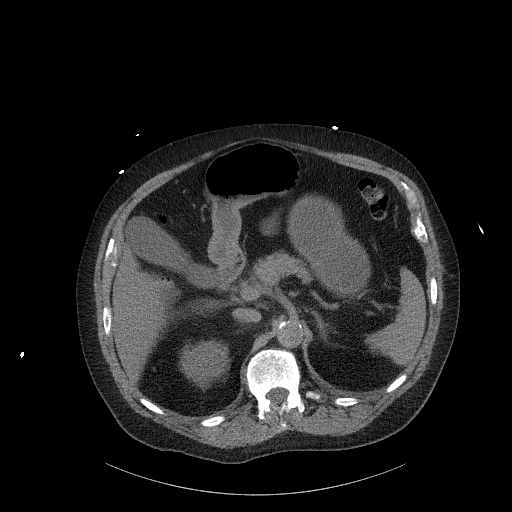
[im 74/94  soft-tissue]
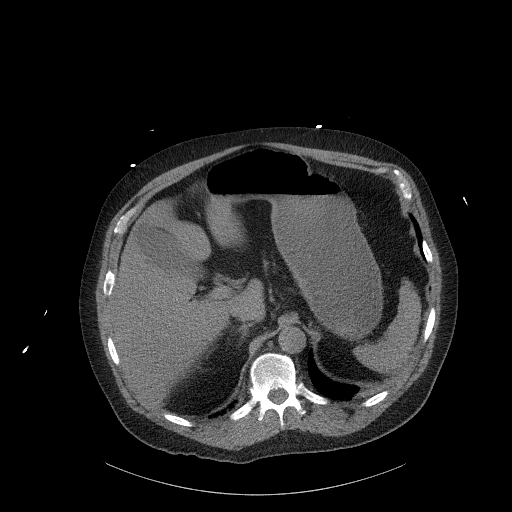
[im 82/94  soft-tissue]
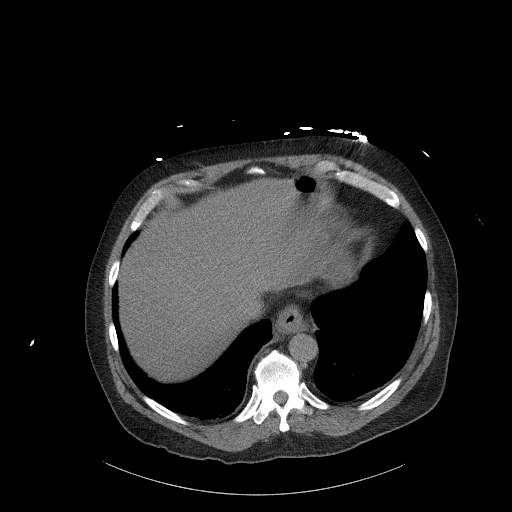
[im 90/94  soft-tissue]
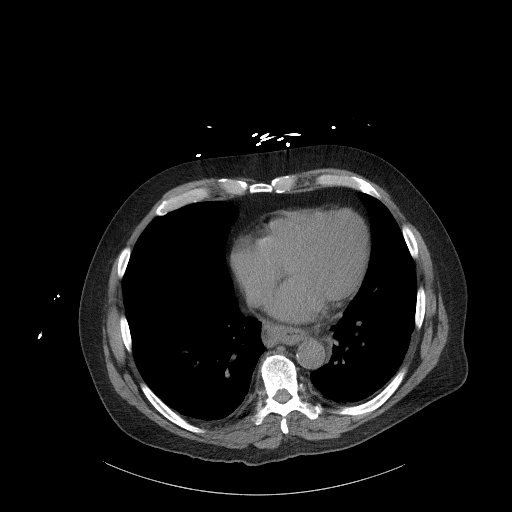

[Series 6: a/p w/o cor · coronal · non-contrast · 0.84mm/px · 3 of 186 slices shown]
[im 62/186  soft-tissue]
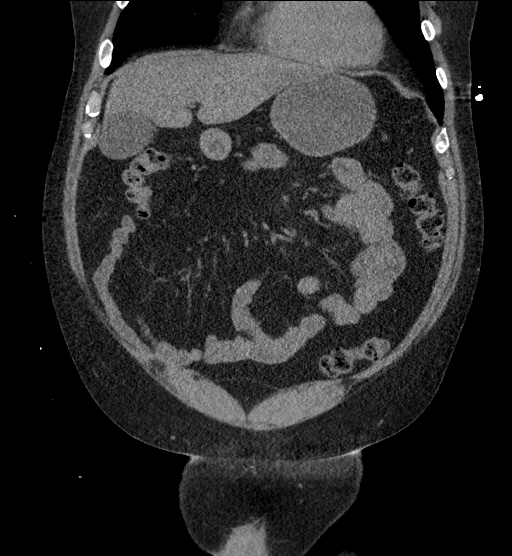
[im 83/186  soft-tissue]
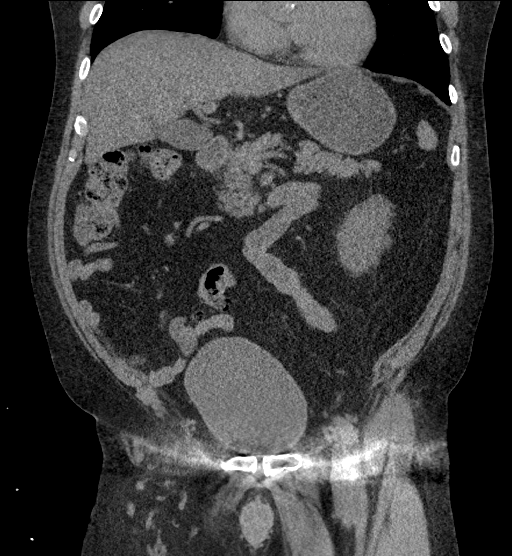
[im 103/186  soft-tissue]
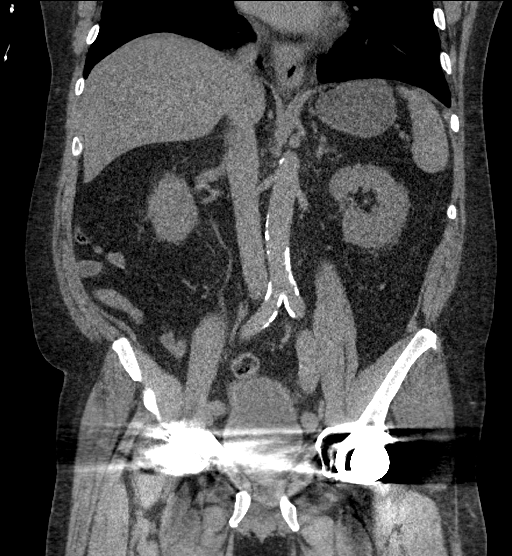

[17 of 46 positions shown; findings below may reference images not displayed]

FINDINGS: Lower chest: Visualized lung bases are unremarkable. Wall thickening
of distal esophagus is noted concerning for esophagitis.

Hepatobiliary: Hepatic steatosis is noted. No gallstones,
gallbladder wall thickening, or biliary dilatation.

Pancreas: Unremarkable. No pancreatic ductal dilatation or
surrounding inflammatory changes.

Spleen: Normal in size without focal abnormality.

Adrenals/Urinary Tract: Adrenal glands are unremarkable. Kidneys are
normal, without renal calculi, focal lesion, or hydronephrosis.
Bladder is unremarkable.

Stomach/Bowel: Stomach is within normal limits. Appendix appears
normal. No evidence of bowel wall thickening, distention, or
inflammatory changes.

Vascular/Lymphatic: Aortic atherosclerosis. No enlarged abdominal or
pelvic lymph nodes.

Reproductive: Prostate is unremarkable.

Other: No abdominal wall hernia or abnormality. No abdominopelvic
ascites.

Musculoskeletal: Status post bilateral total hip arthroplasties. No
acute abnormality is noted.
IMPRESSION: Wall thickening of distal esophagus is noted concerning for
esophagitis. Endoscopy may be performed for further evaluation.

Hepatic steatosis.

Aortic Atherosclerosis ([id number]).

## 2023-10-18 IMAGING — DX DG CHEST 1V PORT
1 series · 1 of 1 positions shown · non-contrast
Comparison: None.

CLINICAL DATA: Chest pain since last night along with SOB. Using
bourbon and Viagra. Thinks he took 1 too many pills

EXAM:
PORTABLE CHEST 1 VIEW

[chest ap]
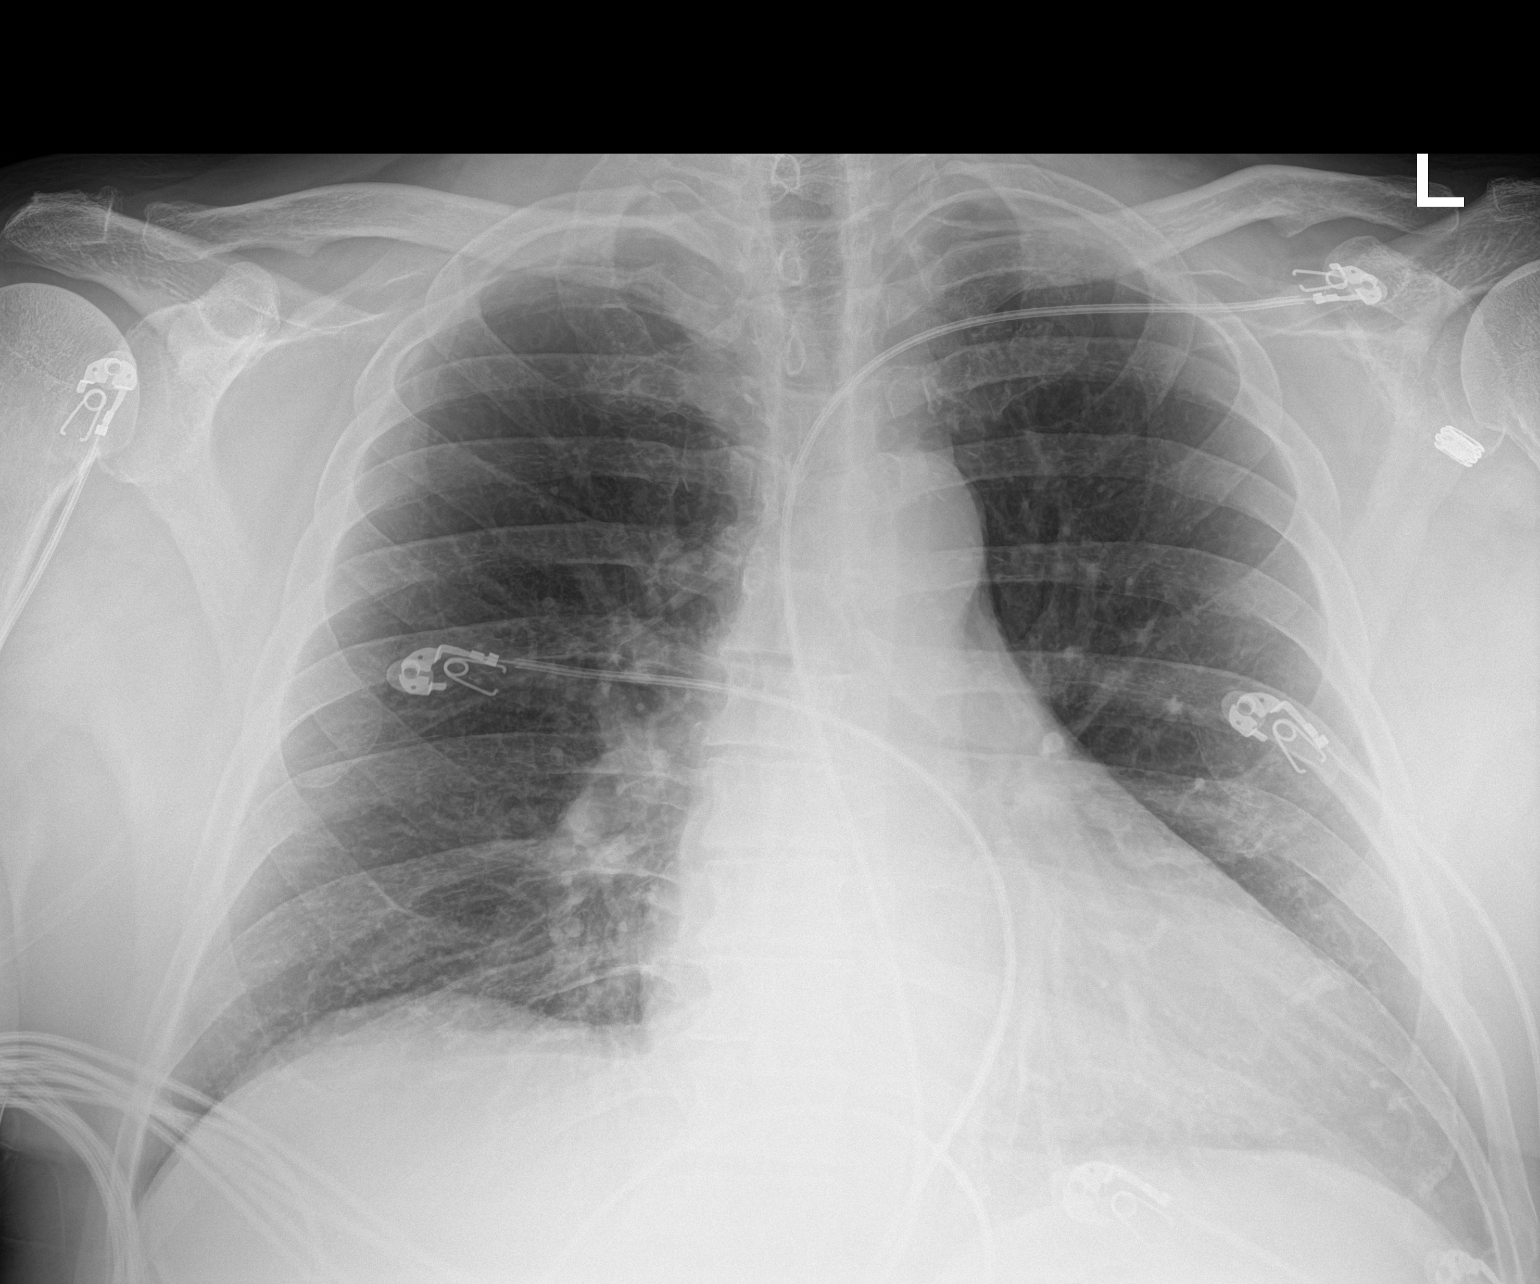

[1 of 1 positions shown; findings below may reference images not displayed]

FINDINGS: The heart size and mediastinal contours are within normal limits. No
focal consolidation. No visible pleural effusion or pneumothorax.
The visualized skeletal structures are unremarkable.
IMPRESSION: 1. No acute cardiopulmonary findings.
2. Low lung volumes without evidence of active disease in the chest.

## 2023-10-21 DIAGNOSIS — R011 Cardiac murmur, unspecified: Secondary | ICD-10-CM | POA: Diagnosis not present

## 2023-10-21 DIAGNOSIS — I480 Paroxysmal atrial fibrillation: Secondary | ICD-10-CM | POA: Diagnosis not present

## 2023-10-29 DIAGNOSIS — B078 Other viral warts: Secondary | ICD-10-CM | POA: Diagnosis not present

## 2023-10-29 DIAGNOSIS — D492 Neoplasm of unspecified behavior of bone, soft tissue, and skin: Secondary | ICD-10-CM | POA: Diagnosis not present

## 2023-10-29 DIAGNOSIS — L538 Other specified erythematous conditions: Secondary | ICD-10-CM | POA: Diagnosis not present

## 2023-10-31 DIAGNOSIS — N401 Enlarged prostate with lower urinary tract symptoms: Secondary | ICD-10-CM | POA: Diagnosis not present

## 2023-10-31 DIAGNOSIS — R972 Elevated prostate specific antigen [PSA]: Secondary | ICD-10-CM | POA: Diagnosis not present

## 2023-10-31 DIAGNOSIS — N529 Male erectile dysfunction, unspecified: Secondary | ICD-10-CM | POA: Diagnosis not present

## 2023-10-31 DIAGNOSIS — R350 Frequency of micturition: Secondary | ICD-10-CM | POA: Diagnosis not present

## 2023-11-08 DIAGNOSIS — I480 Paroxysmal atrial fibrillation: Secondary | ICD-10-CM | POA: Diagnosis not present

## 2023-11-08 DIAGNOSIS — R011 Cardiac murmur, unspecified: Secondary | ICD-10-CM | POA: Diagnosis not present

## 2023-11-14 ENCOUNTER — Ambulatory Visit (HOSPITAL_COMMUNITY): Payer: Medicare Other | Attending: Cardiology

## 2023-11-14 ENCOUNTER — Encounter: Payer: Self-pay | Admitting: *Deleted

## 2023-11-14 DIAGNOSIS — R011 Cardiac murmur, unspecified: Secondary | ICD-10-CM | POA: Diagnosis not present

## 2023-11-14 LAB — ECHOCARDIOGRAM COMPLETE
AR max vel: 1.85 cm2
AV Area VTI: 1.81 cm2
AV Area mean vel: 1.83 cm2
AV Mean grad: 8 mm[Hg]
AV Peak grad: 12.3 mm[Hg]
Ao pk vel: 1.76 m/s
Area-P 1/2: 3.29 cm2
S' Lateral: 2.5 cm

## 2023-12-04 DIAGNOSIS — D492 Neoplasm of unspecified behavior of bone, soft tissue, and skin: Secondary | ICD-10-CM | POA: Diagnosis not present

## 2023-12-04 DIAGNOSIS — L300 Nummular dermatitis: Secondary | ICD-10-CM | POA: Diagnosis not present

## 2023-12-04 DIAGNOSIS — B079 Viral wart, unspecified: Secondary | ICD-10-CM | POA: Diagnosis not present

## 2023-12-04 DIAGNOSIS — L538 Other specified erythematous conditions: Secondary | ICD-10-CM | POA: Diagnosis not present

## 2023-12-11 DIAGNOSIS — G4733 Obstructive sleep apnea (adult) (pediatric): Secondary | ICD-10-CM | POA: Diagnosis not present

## 2023-12-18 DIAGNOSIS — G4733 Obstructive sleep apnea (adult) (pediatric): Secondary | ICD-10-CM | POA: Diagnosis not present

## 2023-12-24 DIAGNOSIS — G4733 Obstructive sleep apnea (adult) (pediatric): Secondary | ICD-10-CM | POA: Diagnosis not present

## 2024-01-15 ENCOUNTER — Ambulatory Visit: Payer: Medicare Other | Admitting: Cardiology

## 2024-01-23 ENCOUNTER — Encounter: Payer: Self-pay | Admitting: Cardiology

## 2024-01-23 ENCOUNTER — Ambulatory Visit: Payer: Medicare Other | Attending: Cardiology | Admitting: Cardiology

## 2024-01-23 VITALS — BP 112/80 | HR 60 | Ht 73.0 in | Wt 207.0 lb

## 2024-01-23 DIAGNOSIS — R011 Cardiac murmur, unspecified: Secondary | ICD-10-CM

## 2024-01-23 DIAGNOSIS — I480 Paroxysmal atrial fibrillation: Secondary | ICD-10-CM | POA: Diagnosis not present

## 2024-01-23 DIAGNOSIS — I10 Essential (primary) hypertension: Secondary | ICD-10-CM | POA: Diagnosis not present

## 2024-01-23 DIAGNOSIS — I700 Atherosclerosis of aorta: Secondary | ICD-10-CM

## 2024-01-23 MED ORDER — METOPROLOL TARTRATE 25 MG PO TABS: 12.5000 mg | ORAL_TABLET | Freq: Two times a day (BID) | ORAL | 2 refills | Status: DC

## 2024-01-23 NOTE — Progress Notes (Signed)
 Cardiology Office Note:   Date:  01/23/2024  ID:  Jeffrey Farrell, DOB 1956/04/27, MRN [id number] PCP: Collene Mares, PA  [facility] Providers Cardiologist:  Charlton Haws, MD    History of Present Illness:   Discussed the use of AI scribe software for clinical note transcription with the patient, who gave verbal consent to proceed.  Jeffrey Farrell "Jeffrey Farrell" is a 67 year old male with atrial fibrillation who presents for follow-up regarding anticoagulation management.  Initially diagnosed with atrial fibrillation in December 2022 after experiencing chest pain and shortness of breath, associated with heavy alcohol consumption and reported overuse of Viagra. During hospitalization, he spontaneously converted to normal sinus rhythm. A two-week heart monitor following this episode showed no atrial fibrillation. In November 2024 at follow up, he reported occasionally feeling an irregular pulse. He was put back on Eliquis out of concern for recurrent afib with Chads2Vasc score 3. A Zio was placed, which again showed no atrial fibrillation but revealed isolated supraventricular and ventricular ectopic beats, as well as a first-degree AV block. Currently on Eliquis without complications/side effects. Denies symptoms of recurrent atrial fibrillation, palpitations, or shortness of breath.  Uses tadalafil for erectile dysfunction, managed by urology and has resumed a daily dose of 5 mg. Experiences nasal congestion as a side effect but no headaches.  Has a history of sleep apnea and struggles with CPAP use due to discomfort with the mask, exacerbated by broken teeth. Attempts to use the CPAP machine but finds it challenging. Sleep apnea events vary nightly.  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      Today patient denies chest pain, shortness of  breath, lower extremity edema, fatigue, palpitations, melena, hematuria, hemoptysis, diaphoresis, weakness, presyncope, syncope, orthopnea, and PND.   Studies Reviewed:    11/11/23 Zio  Patient had a min HR of 43 bpm, max HR of 207 bpm, and avg HR of 70 bpm. Predominant underlying rhythm was Sinus Rhythm. First Degree AV Block was present. 1 run of Ventricular Tachycardia occurred lasting 4 beats with a max rate of 152 bpm (avg 126  bpm). 16 Supraventricular Tachycardia runs occurred, the run with the fastest interval lasting 4 beats with a max rate of 207 bpm, the longest lasting 5 beats with an avg rate of 98 bpm. Some episodes of Supraventricular Tachycardia may be possible  Atrial Tachycardia with variable block. Supraventricular Tachycardia was detected within +/- 45 seconds of symptomatic patient event(s). Isolated SVEs were rare (<1.0%), SVE Couplets were rare (<1.0%), and SVE Triplets were rare (<1.0%). Isolated VEs  were rare (<1.0%), VE Couplets were rare (<1.0%), and no VE Triplets were present.  11/14/23 Echo  IMPRESSIONS     1. Left ventricular ejection fraction, by estimation, is 55 to 60%. The  left ventricle has normal function. The left ventricle has no regional  wall motion abnormalities. Left ventricular diastolic parameters were  normal.   2. Right ventricular systolic function is normal. The right ventricular  size is normal. There is normal pulmonary artery systolic pressure. The  estimated right ventricular systolic pressure is 21.0 mmHg.   3. Left atrial size was mild to moderately dilated.   4. Right atrial size was mildly dilated.   5. The mitral valve is normal in structure. Trivial mitral valve  regurgitation. No evidence of mitral stenosis.   6. The aortic  valve is tricuspid. There is moderate calcification of the  aortic valve. Aortic valve regurgitation is not visualized. Aortic valve  sclerosis/calcification is present, without any evidence of aortic   stenosis. Aortic valve mean gradient  measures 8.0 mmHg.   7. The inferior vena cava is normal in size with greater than 50%  respiratory variability, suggesting right atrial pressure of 3 mmHg.   FINDINGS   Left Ventricle: Left ventricular ejection fraction, by estimation, is 55  to 60%. The left ventricle has normal function. The left ventricle has no  regional wall motion abnormalities. The left ventricular internal cavity  size was normal in size. There is   no left ventricular hypertrophy. Left ventricular diastolic parameters  were normal.   Right Ventricle: The right ventricular size is normal. No increase in  right ventricular wall thickness. Right ventricular systolic function is  normal. There is normal pulmonary artery systolic pressure. The tricuspid  regurgitant velocity is 2.12 m/s, and   with an assumed right atrial pressure of 3 mmHg, the estimated right  ventricular systolic pressure is 21.0 mmHg.   Left Atrium: Left atrial size was mild to moderately dilated.   Right Atrium: Right atrial size was mildly dilated.   Pericardium: There is no evidence of pericardial effusion.   Mitral Valve: The mitral valve is normal in structure. Trivial mitral  valve regurgitation. No evidence of mitral valve stenosis.   Tricuspid Valve: The tricuspid valve is normal in structure. Tricuspid  valve regurgitation is trivial.   Aortic Valve: The aortic valve is tricuspid. There is moderate  calcification of the aortic valve. Aortic valve regurgitation is not  visualized. Aortic valve sclerosis/calcification is present, without any  evidence of aortic stenosis. Aortic valve mean  gradient measures 8.0 mmHg. Aortic valve peak gradient measures 12.3 mmHg.  Aortic valve area, by VTI measures 1.81 cm.   Pulmonic Valve: The pulmonic valve was normal in structure. Pulmonic valve  regurgitation is trivial.   Aorta: The aortic root is normal in size and structure.   Venous: The  inferior vena cava is normal in size with greater than 50%  respiratory variability, suggesting right atrial pressure of 3 mmHg.   IAS/Shunts: No atrial level shunt detected by color flow Doppler.   Risk Assessment/Calculations:    CHA2DS2-VASc Score = 3   This indicates a 3.2% annual risk of stroke. The patient's score is based upon: CHF History: 0 HTN History: 1 Diabetes History: 0 Stroke History: 0 Vascular Disease History: 1 Age Score: 1 Gender Score: 0             Physical Exam:   VS:  BP 112/80   Pulse 60   Ht 6\' 1"  (1.854 m)   Wt 207 lb (93.9 kg)   SpO2 95%   BMI 27.31 kg/m    Wt Readings from Last 3 Encounters:  01/23/24 207 lb (93.9 kg)  10/07/23 206 lb 9.6 oz (93.7 kg)  10/05/22 178 lb 9.6 oz (81 kg)     Physical Exam Vitals reviewed.  Constitutional:      Appearance: Normal appearance.  HENT:     Head: Normocephalic.  Eyes:     Pupils: Pupils are equal, round, and reactive to light.  Cardiovascular:     Rate and Rhythm: Normal rate and regular rhythm.     Pulses: Normal pulses.     Heart sounds: Murmur (systolic RUSB) heard.  Pulmonary:     Effort: Pulmonary effort is normal.  Breath sounds: Normal breath sounds.  Abdominal:     General: Abdomen is flat.     Palpations: Abdomen is soft.  Musculoskeletal:     Right lower leg: No edema.     Left lower leg: No edema.  Skin:    General: Skin is warm and dry.     Capillary Refill: Capillary refill takes less than 2 seconds.  Neurological:     General: No focal deficit present.     Mental Status: He is alert and oriented to person, place, and time.  Psychiatric:        Mood and Affect: Mood normal.        Behavior: Behavior normal.        Thought Content: Thought content normal.        Judgment: Judgment normal.      ASSESSMENT AND PLAN:     Assessment and Plan    Atrial Fibrillation History of AFib in 2022, no recent episodes detected on heart monitor. Patient has moderate risk of  stroke. Discussed the possibility of discontinuing Eliquis. -Refer to electrophysiology for consultation regarding implantable loop recorder to monitor for recurrent AFib. Would consider discontinue Eliquis if he has no recurrent afib during first months after ILR placement. -Continue Eliquis until further guidance from electrophysiology. -Emphasized ongoing alcohol abstinence  Hypertension Well controlled. Discussed potential impact of metoprolol on erectile dysfunction. -Reduce metoprolol to 12.5mg  twice daily. -Patient to monitor blood pressure at home and report any significant changes.  Erectile Dysfunction Patient reports use of tadalafil. Discussed potential impact of metoprolol on erectile dysfunction. -Continue current regimen of tadalafil per urology.  Aortic Atherosclerosis Presence of aortic plaque noted on previous CT scan. Contributes to CHA2DS2-VASc score (doesn't appear to have been previously counted in patient's scoring). Cholesterol levels excellent with recent lipid panel from January 2025 showing 70 mg/dL. No re-evaluation needed at this time.  Sleep Apnea Patient reports difficulty with CPAP due to mask discomfort. -Encourage continued use of CPAP and troubleshooting mask fit with his sleep specialist.  Alcohol Use Patient reports abstinence from alcohol use since hospitalization for AFib in 2022. -Encourage continued abstinence from alcohol to reduce risk of recurrent AFib.  Follow-up in 1 year or sooner if any issues arise with change in metoprolol dose.                 Signed, Perlie Gold, PA-C

## 2024-01-23 NOTE — Patient Instructions (Addendum)
 Medication Instructions:  CHANGED Metoprolol to 25 mg take half  tablet by mouth twice daily   *If you need a refill on your cardiac medications before your next appointment, please call your pharmacy*  Follow-Up: At [facility], you and your health needs are our priority.  As part of our continuing mission to provide you with exceptional heart care, we have created designated Provider Care Teams.  These Care Teams include your primary Cardiologist (physician) and Advanced Practice Providers (APPs -  Physician Assistants and Nurse Practitioners) who all work together to provide you with the care you need, when you need it.   Your next appointment:   1 year(s)  Provider:   Charlton Haws, MD     Other Instructions Referral placed to EP      1st Floor: - Lobby - Registration  - Pharmacy  - Lab - Cafe  2nd Floor: - PV Lab - Diagnostic Testing (echo, CT, nuclear med)  3rd Floor: - Vacant  4th Floor: - TCTS (cardiothoracic surgery) - AFib Clinic - Structural Heart Clinic - Vascular Surgery  - Vascular Ultrasound  5th Floor: - HeartCare Cardiology (general and EP) - Clinical Pharmacy for coumadin, hypertension, lipid, weight-loss medications, and med management appointments    Valet parking services will be available as well.

## 2024-01-24 DIAGNOSIS — G4733 Obstructive sleep apnea (adult) (pediatric): Secondary | ICD-10-CM | POA: Diagnosis not present

## 2024-01-27 DIAGNOSIS — N401 Enlarged prostate with lower urinary tract symptoms: Secondary | ICD-10-CM | POA: Diagnosis not present

## 2024-01-27 DIAGNOSIS — N529 Male erectile dysfunction, unspecified: Secondary | ICD-10-CM | POA: Diagnosis not present

## 2024-01-27 DIAGNOSIS — R972 Elevated prostate specific antigen [PSA]: Secondary | ICD-10-CM | POA: Diagnosis not present

## 2024-01-31 ENCOUNTER — Other Ambulatory Visit: Payer: Self-pay

## 2024-01-31 MED ORDER — APIXABAN 5 MG PO TABS: 5.0000 mg | ORAL_TABLET | Freq: Two times a day (BID) | ORAL | 3 refills | Status: DC

## 2024-01-31 NOTE — Telephone Encounter (Signed)
 Prescription refill request for Eliquis received. Indication:AFIB Last office visit:2/25 Scr:0.89  11/24 Age: 68 Weight:93.9  kg  Prescription refilled

## 2024-02-10 DIAGNOSIS — L309 Dermatitis, unspecified: Secondary | ICD-10-CM | POA: Diagnosis not present

## 2024-02-10 DIAGNOSIS — L439 Lichen planus, unspecified: Secondary | ICD-10-CM | POA: Diagnosis not present

## 2024-02-10 DIAGNOSIS — Z08 Encounter for follow-up examination after completed treatment for malignant neoplasm: Secondary | ICD-10-CM | POA: Diagnosis not present

## 2024-02-10 DIAGNOSIS — L570 Actinic keratosis: Secondary | ICD-10-CM | POA: Diagnosis not present

## 2024-02-10 DIAGNOSIS — L821 Other seborrheic keratosis: Secondary | ICD-10-CM | POA: Diagnosis not present

## 2024-02-10 DIAGNOSIS — L905 Scar conditions and fibrosis of skin: Secondary | ICD-10-CM | POA: Diagnosis not present

## 2024-02-10 DIAGNOSIS — D492 Neoplasm of unspecified behavior of bone, soft tissue, and skin: Secondary | ICD-10-CM | POA: Diagnosis not present

## 2024-02-12 DIAGNOSIS — G4733 Obstructive sleep apnea (adult) (pediatric): Secondary | ICD-10-CM | POA: Diagnosis not present

## 2024-02-12 DIAGNOSIS — I4891 Unspecified atrial fibrillation: Secondary | ICD-10-CM | POA: Diagnosis not present

## 2024-02-12 DIAGNOSIS — F5101 Primary insomnia: Secondary | ICD-10-CM | POA: Diagnosis not present

## 2024-02-12 DIAGNOSIS — I10 Essential (primary) hypertension: Secondary | ICD-10-CM | POA: Diagnosis not present

## 2024-02-15 IMAGING — CR DG CHEST 2V
2 series · 2 of 2 positions shown · non-contrast
Comparison: 11/08/2021

CLINICAL DATA: Upper respiratory infection, shortness of breath,
fever

EXAM:
CHEST - 2 VIEW

[w chest pa *]
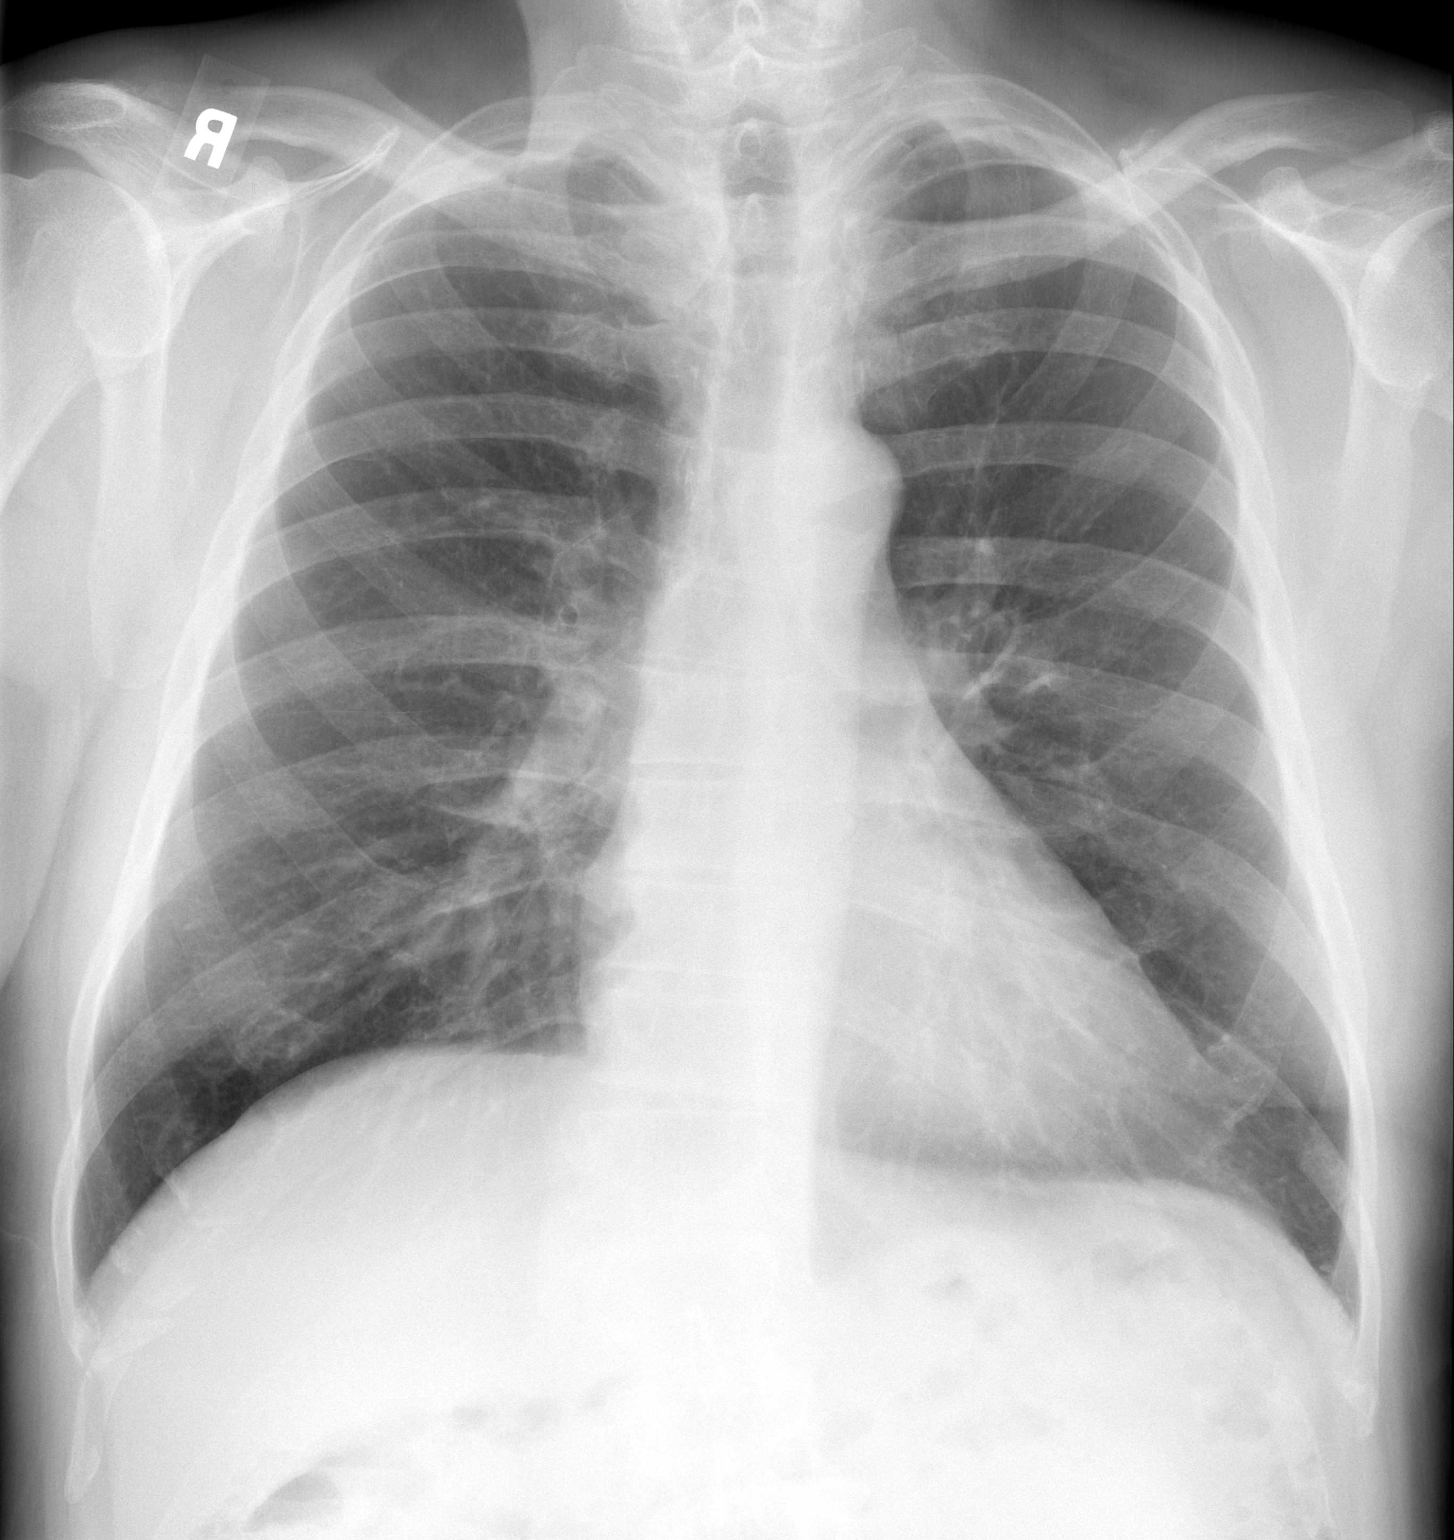

[w chest lat *]
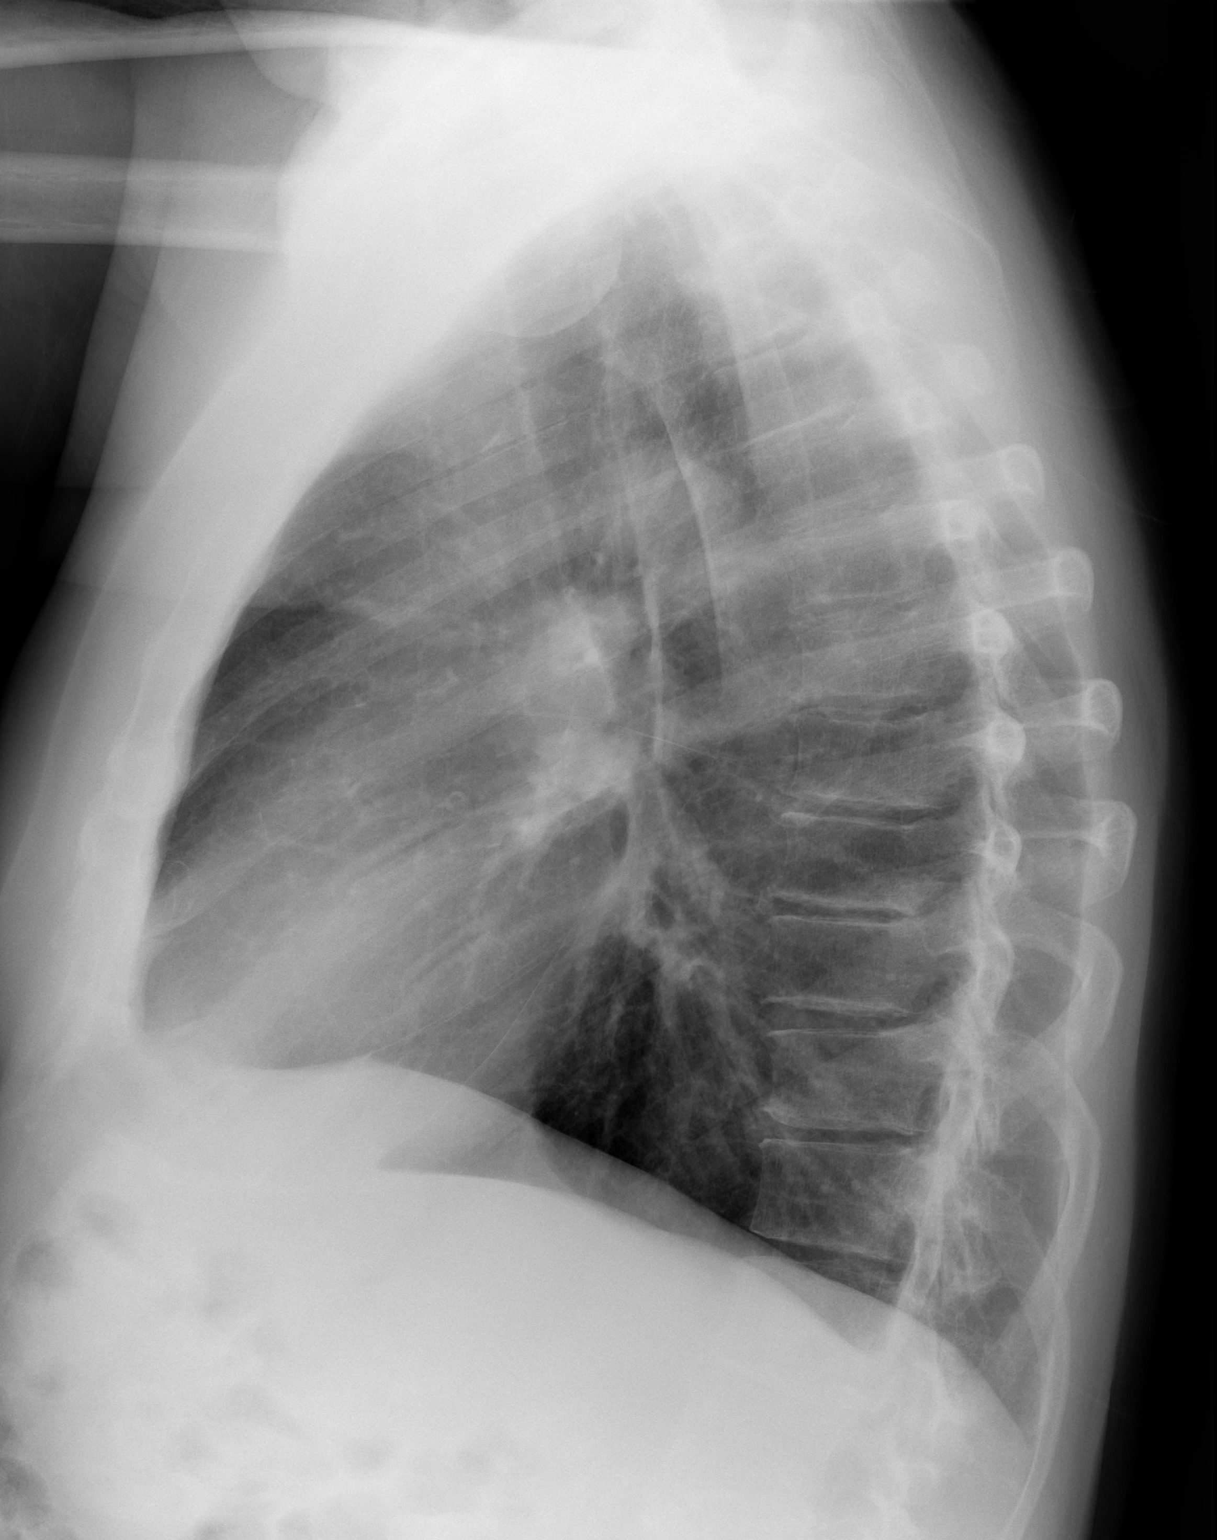

[2 of 2 positions shown; findings below may reference images not displayed]

FINDINGS: The heart size and mediastinal contours are within normal limits.
Both lungs are clear. The visualized skeletal structures are
unremarkable.
IMPRESSION: No active cardiopulmonary disease.

## 2024-02-20 ENCOUNTER — Other Ambulatory Visit: Payer: Self-pay | Admitting: Cardiovascular Disease

## 2024-02-21 DIAGNOSIS — G4733 Obstructive sleep apnea (adult) (pediatric): Secondary | ICD-10-CM | POA: Diagnosis not present

## 2024-03-05 DIAGNOSIS — Z1211 Encounter for screening for malignant neoplasm of colon: Secondary | ICD-10-CM | POA: Diagnosis not present

## 2024-03-06 ENCOUNTER — Ambulatory Visit: Admitting: Cardiology

## 2024-03-06 ENCOUNTER — Ambulatory Visit: Payer: Medicare Other | Attending: Cardiology | Admitting: Cardiovascular Disease

## 2024-03-06 ENCOUNTER — Encounter: Payer: Self-pay | Admitting: Cardiovascular Disease

## 2024-03-06 VITALS — BP 110/62 | HR 57 | Ht 73.0 in | Wt 206.0 lb

## 2024-03-06 DIAGNOSIS — I480 Paroxysmal atrial fibrillation: Secondary | ICD-10-CM | POA: Diagnosis not present

## 2024-03-06 DIAGNOSIS — J309 Allergic rhinitis, unspecified: Secondary | ICD-10-CM | POA: Diagnosis not present

## 2024-03-06 DIAGNOSIS — J3489 Other specified disorders of nose and nasal sinuses: Secondary | ICD-10-CM | POA: Diagnosis not present

## 2024-03-06 DIAGNOSIS — Z23 Encounter for immunization: Secondary | ICD-10-CM | POA: Diagnosis not present

## 2024-03-06 NOTE — Progress Notes (Signed)
 Electrophysiology Office Note:    Date:  03/06/2024   ID:  Jeffrey Farrell, DOB 1956/05/25, MRN [id number]  PCP:  Collene Mares, PA   [facility] Providers Cardiologist:  Charlton Haws, MD Electrophysiologist:  Maurice Small, MD     Referring MD: Perlie Gold, PA-C   History of Present Illness:    Jeffrey Farrell is a 68 y.o. male with a medical history significant for atrial fibrillation, referred for arrhythmia management.      Discussed the use of AI scribe software for clinical note transcription with the patient, who gave verbal consent to proceed.  History of Present Illness Jeffrey Farrell "Jeffrey Farrell" is a 68 year old male with atrial fibrillation who presents for discussion regarding monitoring options.  He was diagnosed with atrial fibrillation in December 2022, which he attributes to heavy alcohol consumption, excessive use of erectile dysfunction medication, and pseudoephedrine. During a symptomatic episode, he experienced a racing heart and called an ambulance, leading to a three-day hospital admission where his rhythm was corrected. Since then, he has abstained from alcohol and reports no recurrence of atrial fibrillation.  He has been actively monitoring his heart rate using a pulse oximeter, noting fluctuations but no irregularities. A recent echocardiogram and a two-week heart monitor did not indicate any recurrence of atrial fibrillation.  He has a history of sleep apnea and uses a CPAP machine. He struggles with the machine and currently experiences 11 events per hour, although he has previously reduced this to six events per hour.  He suffers from severe allergies, particularly in April and May, and has a history of receiving allergy shots. However, due to Medicare coverage limitations, he is not currently off allergy shots and manages his symptoms without them.         Today, he reports he reports that he is doing well.   EKGs/Labs/Other  Studies Reviewed Today:     Echocardiogram:  TTE 10/2023 LVEF 5-60%. Left atrial size is moderately dilated.   Monitors:  14 day monitor 10/21/2023  -- my interpretation Sinus rhythm HR 43-154, avg 70 16 episodes of nonsustained SVT   EKG:   EKG Interpretation Date/Time:  Friday March 06 2024 12:12:36 EDT Ventricular Rate:  55 PR Interval:  226 QRS Duration:  82 QT Interval:  408 QTC Calculation: 390 R Axis:   36  Text Interpretation: Sinus bradycardia with 1st degree A-V block When compared with ECG of 07-Oct-2023 13:19, No significant change was found Confirmed by York Pellant [phone]) on 03/06/2024 12:18:43 PM     Physical Exam:    VS:  BP 110/62 (BP Location: Left Arm, Patient Position: Sitting, Cuff Size: Large)   Pulse (!) 57   Ht 6\' 1"  (1.854 m)   Wt 206 lb (93.4 kg)   SpO2 96%   BMI 27.18 kg/m     Wt Readings from Last 3 Encounters:  03/06/24 206 lb (93.4 kg)  01/23/24 207 lb (93.9 kg)  10/07/23 206 lb 9.6 oz (93.7 kg)     GEN: Well nourished, well developed in no acute distress CARDIAC: RRR, no murmurs, rubs, gallops RESPIRATORY:  Normal work of breathing MUSCULOSKELETAL: no edema    ASSESSMENT & PLAN:     Atrial fibrillation Single documented episode, likely precipitated by excessive alcohol consumption He was symptomatic during this episode and presented to the ER He has since quit drinking alcohol Monitor showed atrial runs; he has sleep apnea and struggles with CPAP -- which correlate with  increased risk of AF  Secondary hyper coagulable state CHA2DS2-VASc score is 3 Continue eliquis for now. I think it reasonable to discontinue eliquis though I feel more confident with monitoring for AF -- by a wearable device or loop recorder Finances are limited and will figure into the decision He is going to investigate options for wearable devices.  OSA We discussed the importance of CPAP use to prevent recurrence of  AF    Signed, Maurice Small, MD  03/06/2024 12:48 PM    [facility]

## 2024-03-06 NOTE — Patient Instructions (Signed)
 Medication Instructions:  Your physician recommends that you continue on your current medications as directed. Please refer to the Current Medication list given to you today. *If you need a refill on your cardiac medications before your next appointment, please call your pharmacy*  Follow-Up: At [facility], you and your health needs are our priority.  As part of our continuing mission to provide you with exceptional heart care, our providers are all part of one team.  This team includes your primary Cardiologist (physician) and Advanced Practice Providers or APPs (Physician Assistants and Nurse Practitioners) who all work together to provide you with the care you need, when you need it.  Your next appointment:   3 month(s)  Provider:   York Pellant, MD        1st Floor: - Lobby - Registration  - Pharmacy  - Lab - Cafe  2nd Floor: - PV Lab - Diagnostic Testing (echo, CT, nuclear med)  3rd Floor: - Vacant  4th Floor: - TCTS (cardiothoracic surgery) - AFib Clinic - Structural Heart Clinic - Vascular Surgery  - Vascular Ultrasound  5th Floor: - HeartCare Cardiology (general and EP) - Clinical Pharmacy for coumadin, hypertension, lipid, weight-loss medications, and med management appointments    Valet parking services will be available as well.

## 2024-03-17 ENCOUNTER — Encounter: Payer: Self-pay | Admitting: Cardiovascular Disease

## 2024-03-17 DIAGNOSIS — R739 Hyperglycemia, unspecified: Secondary | ICD-10-CM | POA: Diagnosis not present

## 2024-03-17 DIAGNOSIS — I4891 Unspecified atrial fibrillation: Secondary | ICD-10-CM | POA: Diagnosis not present

## 2024-03-17 DIAGNOSIS — I10 Essential (primary) hypertension: Secondary | ICD-10-CM | POA: Diagnosis not present

## 2024-03-17 DIAGNOSIS — Z125 Encounter for screening for malignant neoplasm of prostate: Secondary | ICD-10-CM | POA: Diagnosis not present

## 2024-03-17 DIAGNOSIS — E039 Hypothyroidism, unspecified: Secondary | ICD-10-CM | POA: Diagnosis not present

## 2024-03-23 DIAGNOSIS — R0989 Other specified symptoms and signs involving the circulatory and respiratory systems: Secondary | ICD-10-CM | POA: Diagnosis not present

## 2024-03-26 ENCOUNTER — Other Ambulatory Visit: Payer: Self-pay | Admitting: Cardiovascular Disease

## 2024-04-01 DIAGNOSIS — G4733 Obstructive sleep apnea (adult) (pediatric): Secondary | ICD-10-CM | POA: Diagnosis not present

## 2024-04-08 DIAGNOSIS — G4733 Obstructive sleep apnea (adult) (pediatric): Secondary | ICD-10-CM | POA: Diagnosis not present

## 2024-04-17 DIAGNOSIS — R0989 Other specified symptoms and signs involving the circulatory and respiratory systems: Secondary | ICD-10-CM | POA: Diagnosis not present

## 2024-04-27 DIAGNOSIS — N529 Male erectile dysfunction, unspecified: Secondary | ICD-10-CM | POA: Diagnosis not present

## 2024-04-27 DIAGNOSIS — R972 Elevated prostate specific antigen [PSA]: Secondary | ICD-10-CM | POA: Diagnosis not present

## 2024-04-27 DIAGNOSIS — N401 Enlarged prostate with lower urinary tract symptoms: Secondary | ICD-10-CM | POA: Diagnosis not present

## 2024-05-07 DIAGNOSIS — M1711 Unilateral primary osteoarthritis, right knee: Secondary | ICD-10-CM | POA: Diagnosis not present

## 2024-06-01 ENCOUNTER — Other Ambulatory Visit: Payer: Self-pay | Admitting: Cardiology

## 2024-06-01 ENCOUNTER — Other Ambulatory Visit: Payer: Self-pay | Admitting: *Deleted

## 2024-06-01 DIAGNOSIS — G5762 Lesion of plantar nerve, left lower limb: Secondary | ICD-10-CM | POA: Diagnosis not present

## 2024-06-01 DIAGNOSIS — S99922A Unspecified injury of left foot, initial encounter: Secondary | ICD-10-CM | POA: Diagnosis not present

## 2024-06-01 DIAGNOSIS — M2012 Hallux valgus (acquired), left foot: Secondary | ICD-10-CM | POA: Diagnosis not present

## 2024-06-01 DIAGNOSIS — M19072 Primary osteoarthritis, left ankle and foot: Secondary | ICD-10-CM | POA: Diagnosis not present

## 2024-06-01 MED ORDER — APIXABAN 5 MG PO TABS: 5.0000 mg | ORAL_TABLET | Freq: Two times a day (BID) | ORAL | 3 refills | Status: AC

## 2024-06-01 NOTE — Telephone Encounter (Signed)
 Prescription refill request for Eliquis  received. Indication: PAF Last office visit: 03/06/24  A Mealor MD Scr: 0.89 on 10/07/23  Epic Age: 68 Weight: 93.4kg  Based on above findings Eliquis  5mg  twice daily is the appropriate dose.  Refill approved.

## 2024-06-08 DIAGNOSIS — D225 Melanocytic nevi of trunk: Secondary | ICD-10-CM | POA: Diagnosis not present

## 2024-06-08 DIAGNOSIS — L300 Nummular dermatitis: Secondary | ICD-10-CM | POA: Diagnosis not present

## 2024-06-08 DIAGNOSIS — L814 Other melanin hyperpigmentation: Secondary | ICD-10-CM | POA: Diagnosis not present

## 2024-06-08 DIAGNOSIS — L821 Other seborrheic keratosis: Secondary | ICD-10-CM | POA: Diagnosis not present

## 2024-06-08 DIAGNOSIS — L570 Actinic keratosis: Secondary | ICD-10-CM | POA: Diagnosis not present

## 2024-06-11 ENCOUNTER — Telehealth: Payer: Self-pay | Admitting: Cardiovascular Disease

## 2024-06-11 DIAGNOSIS — M6208 Separation of muscle (nontraumatic), other site: Secondary | ICD-10-CM | POA: Diagnosis not present

## 2024-06-11 DIAGNOSIS — Z1211 Encounter for screening for malignant neoplasm of colon: Secondary | ICD-10-CM | POA: Diagnosis not present

## 2024-06-11 NOTE — Telephone Encounter (Signed)
 Patient cancelled 7/24 appointment with Dr. Nancey. He says he hasn't decided whether or not he would like to proceed with ILR at this time. He says he is not confident that it is needed. He says in his case he needs to know specifically why it's needed. He says he was diagnosed with afib but no longer having issues.

## 2024-06-11 NOTE — Telephone Encounter (Signed)
 Spoke to patient and advised that the loop recorder is important fur confident consideration in stopping eliquis . Pt is hesitant of loop recorder placement and pt was asked if he would prefer wearing a long term external heart monitor. Pt declined. Pt requested for appt for further discussion of monitoring. Pt states that he would like to see Dr. Delford or one of his PA-C. Appt made with Orren Fabry, PA.

## 2024-06-15 ENCOUNTER — Other Ambulatory Visit: Payer: Self-pay | Admitting: Internal Medicine

## 2024-06-15 DIAGNOSIS — M6208 Separation of muscle (nontraumatic), other site: Secondary | ICD-10-CM | POA: Diagnosis not present

## 2024-06-15 DIAGNOSIS — Z122 Encounter for screening for malignant neoplasm of respiratory organs: Secondary | ICD-10-CM | POA: Diagnosis not present

## 2024-06-18 ENCOUNTER — Ambulatory Visit: Admitting: Cardiovascular Disease

## 2024-06-22 NOTE — Progress Notes (Deleted)
 Cardiology Office Note   Date:  06/22/2024  ID:  Jeffrey Farrell, DOB [birth year], MRN [id number] PCP: Cleotilde Glorious BRAVO, PA  [facility] Providers Cardiologist:  Maude Emmer, MD Electrophysiologist:  Eulas BRAVO Furbish, MD    History of Present Illness Jeffrey Farrell is a 68 y.o. male with a past medical history significant for atrial fibrillation who was last seen in the office in April by electrophysiology.  He had a single documented episode that was likely precipitated by excessive alcohol consumption of atrial fibrillation.  He presented to the ER.  Since then he quit drinking alcohol.  Monitor showed atrial runs.  Also has sleep apnea and struggles with his CPAP device.  CHA2DS2-VASc score is 3 and it was recommended he continue on Eliquis  for stroke prevention.  Finances are limited and so this has been an issue dating medication.  The patient was asking the cost of an ILR implant monitoring and if his insurance would cover this procedure.  Patient had canceled his 7/24 appointment with Dr. Furbish.  He has not decided whether or not he would like to proceed with ILR at that time.  He stated he is not confident that it is needed.  He also would like to stop Eliquis  due to cost.  Today, he ***  ROS: Pertinent ROS in HPI  Studies Reviewed      11/11/23 Zio   Patient had a min HR of 43 bpm, max HR of 207 bpm, and avg HR of 70 bpm. Predominant underlying rhythm was Sinus Rhythm. First Degree AV Block was present. 1 run of Ventricular Tachycardia occurred lasting 4 beats with a max rate of 152 bpm (avg 126  bpm). 16 Supraventricular Tachycardia runs occurred, the run with the fastest interval lasting 4 beats with a max rate of 207 bpm, the longest lasting 5 beats with an avg rate of 98 bpm. Some episodes of Supraventricular Tachycardia may be possible  Atrial Tachycardia with variable block. Supraventricular Tachycardia was detected within +/- 45 seconds of symptomatic  patient event(s). Isolated SVEs were rare (<1.0%), SVE Couplets were rare (<1.0%), and SVE Triplets were rare (<1.0%). Isolated VEs  were rare (<1.0%), VE Couplets were rare (<1.0%), and no VE Triplets were present.   11/14/23 Echo   IMPRESSIONS     1. Left ventricular ejection fraction, by estimation, is 55 to 60%. The  left ventricle has normal function. The left ventricle has no regional  wall motion abnormalities. Left ventricular diastolic parameters were  normal.   2. Right ventricular systolic function is normal. The right ventricular  size is normal. There is normal pulmonary artery systolic pressure. The  estimated right ventricular systolic pressure is 21.0 mmHg.   3. Left atrial size was mild to moderately dilated.   4. Right atrial size was mildly dilated.   5. The mitral valve is normal in structure. Trivial mitral valve  regurgitation. No evidence of mitral stenosis.   6. The aortic valve is tricuspid. There is moderate calcification of the  aortic valve. Aortic valve regurgitation is not visualized. Aortic valve  sclerosis/calcification is present, without any evidence of aortic  stenosis. Aortic valve mean gradient  measures 8.0 mmHg.   7. The inferior vena cava is normal in size with greater than 50%  respiratory variability, suggesting right atrial pressure of 3 mmHg.   FINDINGS   Left Ventricle: Left ventricular ejection fraction, by estimation, is 55  to 60%. The left ventricle has normal function. The left  ventricle has no  regional wall motion abnormalities. The left ventricular internal cavity  size was normal in size. There is   no left ventricular hypertrophy. Left ventricular diastolic parameters  were normal.   Right Ventricle: The right ventricular size is normal. No increase in  right ventricular wall thickness. Right ventricular systolic function is  normal. There is normal pulmonary artery systolic pressure. The tricuspid  regurgitant velocity is  2.12 m/s, and   with an assumed right atrial pressure of 3 mmHg, the estimated right  ventricular systolic pressure is 21.0 mmHg.   Left Atrium: Left atrial size was mild to moderately dilated.   Right Atrium: Right atrial size was mildly dilated.   Pericardium: There is no evidence of pericardial effusion.   Mitral Valve: The mitral valve is normal in structure. Trivial mitral  valve regurgitation. No evidence of mitral valve stenosis.   Tricuspid Valve: The tricuspid valve is normal in structure. Tricuspid  valve regurgitation is trivial.   Aortic Valve: The aortic valve is tricuspid. There is moderate  calcification of the aortic valve. Aortic valve regurgitation is not  visualized. Aortic valve sclerosis/calcification is present, without any  evidence of aortic stenosis. Aortic valve mean  gradient measures 8.0 mmHg. Aortic valve peak gradient measures 12.3 mmHg.  Aortic valve area, by VTI measures 1.81 cm.   Pulmonic Valve: The pulmonic valve was normal in structure. Pulmonic valve  regurgitation is trivial.   Aorta: The aortic root is normal in size and structure.   Venous: The inferior vena cava is normal in size with greater than 50%  respiratory variability, suggesting right atrial pressure of 3 mmHg.   IAS/Shunts: No atrial level shunt detected by color flow Doppler.   Risk Assessment/Calculations {Does this patient have ATRIAL FIBRILLATION?[phone]} No BP recorded.  {Refresh Note OR Click here to enter BP  :1}***       Physical Exam VS:  There were no vitals taken for this visit.       Wt Readings from Last 3 Encounters:  03/06/24 206 lb (93.4 kg)  01/23/24 207 lb (93.9 kg)  10/07/23 206 lb 9.6 oz (93.7 kg)    GEN: Well nourished, well developed in no acute distress NECK: No JVD; No carotid bruits CARDIAC: ***RRR, no murmurs, rubs, gallops RESPIRATORY:  Clear to auscultation without rales, wheezing or rhonchi  ABDOMEN: Soft, non-tender,  non-distended EXTREMITIES:  No edema; No deformity   ASSESSMENT AND PLAN Atrial fibrillation Hypertension Erectile dysfunction Aortic atherosclerosis Sleep apnea Alcohol use    {Are you ordering a CV Procedure (e.g. stress test, cath, DCCV, TEE, etc)?   Press F2        :[id number]}  Dispo: ***  Signed, Orren LOISE Fabry, PA-C

## 2024-06-23 ENCOUNTER — Encounter: Payer: Self-pay | Admitting: Physician Assistant

## 2024-06-24 ENCOUNTER — Ambulatory Visit: Admitting: Physician Assistant

## 2024-06-24 DIAGNOSIS — R011 Cardiac murmur, unspecified: Secondary | ICD-10-CM

## 2024-06-24 DIAGNOSIS — I480 Paroxysmal atrial fibrillation: Secondary | ICD-10-CM

## 2024-06-24 DIAGNOSIS — I10 Essential (primary) hypertension: Secondary | ICD-10-CM

## 2024-06-24 DIAGNOSIS — I700 Atherosclerosis of aorta: Secondary | ICD-10-CM

## 2024-07-09 NOTE — Progress Notes (Signed)
 Cardiology Office Note   Date:  07/10/2024  ID:  Jeffrey Farrell, DOB [birth year], MRN [id number] PCP: Jeffrey Farrell, Jeffrey  FORBES, PA  [name] Cardiologist:  Maude Emmer, MD Electrophysiologist:  Eulas Farrell Furbish, MD    History of Present Illness Jeffrey Farrell is a 68 y.o. male with a past medical history significant for atrial fibrillation who was last seen in the office in April by electrophysiology.  He had a single documented episode that was likely precipitated by excessive alcohol consumption of atrial fibrillation.  He presented to the ER.  Since then he quit drinking alcohol.  Monitor showed atrial runs.  Also has sleep apnea and struggles with his CPAP device.  CHA2DS2-VASc score is 3 and it was recommended he continue on Eliquis  for stroke prevention.  Finances are limited and so this has been an issue dating medication.  The patient was asking the cost of an ILR implant monitoring and if his insurance would cover this procedure.  Patient had canceled his 7/24 appointment with Dr. Furbish.  He has not decided whether or not he would like to proceed with ILR at that time.  He stated he is not confident that it is needed.  He also would like to stop Eliquis  due to cost.  Today, he presents with atrial fibrillation for discussion regarding the use of a loop recorder and blood thinner management.  He has not experienced any episodes of atrial fibrillation since his hospitalization in December 2022, where his heart rate was 132 bpm. He questions the necessity of a loop recorder due to being asymptomatic and having stable blood pressure. Previously on Eliquis , he was taken off it for six to eight months and is concerned about the risks of stroke, which he understands to be 3.2%. He is considering using a KardiaMobile device for heart monitoring instead of a loop recorder due to concerns about cost and necessity.  I encouraged him to send me a MyChart message with his decision  once made.  For now, he remains on Eliquis .  He is not interested in a loop recorder due to cost and the monitoring fee associated with it.  Kardia mobile monitoring would be a much better option for him.  Reports no shortness of breath nor dyspnea on exertion. Reports no chest pain, pressure, or tightness. No orthopnea, PND. Reports no palpitations.   Discussed the use of AI scribe software for clinical note transcription with the patient, who gave verbal consent to proceed.   ROS: Pertinent ROS in HPI  Studies Reviewed      11/11/23 Zio   Patient had a min HR of 43 bpm, max HR of 207 bpm, and avg HR of 70 bpm. Predominant underlying rhythm was Sinus Rhythm. First Degree AV Block was present. 1 run of Ventricular Tachycardia occurred lasting 4 beats with a max rate of 152 bpm (avg 126  bpm). 16 Supraventricular Tachycardia runs occurred, the run with the fastest interval lasting 4 beats with a max rate of 207 bpm, the longest lasting 5 beats with an avg rate of 98 bpm. Some episodes of Supraventricular Tachycardia may be possible  Atrial Tachycardia with variable block. Supraventricular Tachycardia was detected within +/- 45 seconds of symptomatic patient event(s). Isolated SVEs were rare (<1.0%), SVE Couplets were rare (<1.0%), and SVE Triplets were rare (<1.0%). Isolated VEs  were rare (<1.0%), VE Couplets were rare (<1.0%), and no VE Triplets were present.   11/14/23 Echo   IMPRESSIONS  1. Left ventricular ejection fraction, by estimation, is 55 to 60%. The  left ventricle has normal function. The left ventricle has no regional  wall motion abnormalities. Left ventricular diastolic parameters were  normal.   2. Right ventricular systolic function is normal. The right ventricular  size is normal. There is normal pulmonary artery systolic pressure. The  estimated right ventricular systolic pressure is 21.0 mmHg.   3. Left atrial size was mild to moderately dilated.   4. Right  atrial size was mildly dilated.   5. The mitral valve is normal in structure. Trivial mitral valve  regurgitation. No evidence of mitral stenosis.   6. The aortic valve is tricuspid. There is moderate calcification of the  aortic valve. Aortic valve regurgitation is not visualized. Aortic valve  sclerosis/calcification is present, without any evidence of aortic  stenosis. Aortic valve mean gradient  measures 8.0 mmHg.   7. The inferior vena cava is normal in size with greater than 50%  respiratory variability, suggesting right atrial pressure of 3 mmHg.   FINDINGS   Left Ventricle: Left ventricular ejection fraction, by estimation, is 55  to 60%. The left ventricle has normal function. The left ventricle has no  regional wall motion abnormalities. The left ventricular internal cavity  size was normal in size. There is   no left ventricular hypertrophy. Left ventricular diastolic parameters  were normal.   Right Ventricle: The right ventricular size is normal. No increase in  right ventricular wall thickness. Right ventricular systolic function is  normal. There is normal pulmonary artery systolic pressure. The tricuspid  regurgitant velocity is 2.12 m/s, and   with an assumed right atrial pressure of 3 mmHg, the estimated right  ventricular systolic pressure is 21.0 mmHg.   Left Atrium: Left atrial size was mild to moderately dilated.   Right Atrium: Right atrial size was mildly dilated.   Pericardium: There is no evidence of pericardial effusion.   Mitral Valve: The mitral valve is normal in structure. Trivial mitral  valve regurgitation. No evidence of mitral valve stenosis.   Tricuspid Valve: The tricuspid valve is normal in structure. Tricuspid  valve regurgitation is trivial.   Aortic Valve: The aortic valve is tricuspid. There is moderate  calcification of the aortic valve. Aortic valve regurgitation is not  visualized. Aortic valve sclerosis/calcification is present,  without any  evidence of aortic stenosis. Aortic valve mean  gradient measures 8.0 mmHg. Aortic valve peak gradient measures 12.3 mmHg.  Aortic valve area, by VTI measures 1.81 cm.   Pulmonic Valve: The pulmonic valve was normal in structure. Pulmonic valve  regurgitation is trivial.   Aorta: The aortic root is normal in size and structure.   Venous: The inferior vena cava is normal in size with greater than 50%  respiratory variability, suggesting right atrial pressure of 3 mmHg.   IAS/Shunts: No atrial level shunt detected by color flow Doppler.   Risk Assessment/Calculations  CHA2DS2-VASc Score = 3   This indicates a 3.2% annual risk of stroke. The patient's score is based upon: CHF History: 0 HTN History: 1 Diabetes History: 0 Stroke History: 0 Vascular Disease History: 1 Age Score: 1 Gender Score: 0       Physical Exam VS:  BP 117/68   Pulse (!) 55   Ht 6' 1 (1.854 m)   Wt 203 lb (92.1 kg)   SpO2 96%   BMI 26.78 kg/m        Wt Readings from Last 3 Encounters:  07/10/24 203 lb (92.1 kg)  03/06/24 206 lb (93.4 kg)  01/23/24 207 lb (93.9 kg)    GEN: Well nourished, well developed in no acute distress NECK: No JVD; No carotid bruits CARDIAC: RRR, no murmurs, rubs, gallops RESPIRATORY:  Clear to auscultation without rales, wheezing or rhonchi  ABDOMEN: Soft, non-tender, non-distended EXTREMITIES:  No edema; No deformity   ASSESSMENT AND PLAN  Atrial fibrillation No documented episodes since December 2021. Blood pressure and EKGs well-managed. Concerns about loop recorder cost and invasiveness. - Recommend KardiaMobile for non-invasive, cost-effective monitoring. - Instruct daily EKGs with KardiaMobile, send data biweekly via MyChart. - Advise additional EKGs if symptomatic. - Discussed option to stop Eliquis  if using KardiaMobile.  Anticoagulation management in context of atrial fibrillation Currently on Eliquis . Considering discontinuation due to no  recent AFib episodes and concerns about long-term anticoagulation. Discussed stroke risk (3.2%) versus bleeding risk. - Discussed options: continue Eliquis  without monitoring or stop Eliquis  and use KardiaMobile. - If stopping Eliquis , recommend KardiaMobile monitoring, send data biweekly. - Discussed risks and benefits of each option, including stroke risk and potential bleeding with anticoagulation.  Hypertension -well controlled today -continue current medication regimen      Dispo: He can follow-up in 3 months with Artist Pouch, PA-C  Signed, Orren LOISE Fabry, PA-C

## 2024-07-10 ENCOUNTER — Ambulatory Visit: Attending: Physician Assistant | Admitting: Physician Assistant

## 2024-07-10 VITALS — BP 117/68 | HR 55 | Ht 73.0 in | Wt 203.0 lb

## 2024-07-10 DIAGNOSIS — I480 Paroxysmal atrial fibrillation: Secondary | ICD-10-CM | POA: Diagnosis not present

## 2024-07-10 DIAGNOSIS — I10 Essential (primary) hypertension: Secondary | ICD-10-CM

## 2024-07-10 DIAGNOSIS — F10988 Alcohol use, unspecified with other alcohol-induced disorder: Secondary | ICD-10-CM

## 2024-07-10 DIAGNOSIS — R011 Cardiac murmur, unspecified: Secondary | ICD-10-CM

## 2024-07-10 DIAGNOSIS — I700 Atherosclerosis of aorta: Secondary | ICD-10-CM | POA: Diagnosis not present

## 2024-07-10 DIAGNOSIS — G4733 Obstructive sleep apnea (adult) (pediatric): Secondary | ICD-10-CM

## 2024-07-10 NOTE — Patient Instructions (Signed)
 Medication Instructions:  Your physician recommends that you continue on your current medications as directed. Please refer to the Current Medication list given to you today.  *If you need a refill on your cardiac medications before your next appointment, please call your pharmacy*  Lab Work: NONE ordered at this time of appointment   Testing/Procedures: NONE ordered at this time of appointment   Follow-Up: At [facility], you and your health needs are our priority.  As part of our continuing mission to provide you with exceptional heart care, our providers are all part of one team.  This team includes your primary Cardiologist (physician) and Advanced Practice Providers or APPs (Physician Assistants and Nurse Practitioners) who all work together to provide you with the care you need, when you need it.  Your next appointment:   3 month(s)  Provider:   Artist Pouch, PA-C          We recommend signing up for the patient portal called MyChart.  Sign up information is provided on this After Visit Summary.  MyChart is used to connect with patients for Virtual Visits (Telemedicine).  Patients are able to view lab/test results, encounter notes, upcoming appointments, etc.  Non-urgent messages can be sent to your provider as well.   To learn more about what you can do with MyChart, go to [email or web address].

## 2024-07-14 DIAGNOSIS — J302 Other seasonal allergic rhinitis: Secondary | ICD-10-CM | POA: Diagnosis not present

## 2024-07-16 DIAGNOSIS — M1711 Unilateral primary osteoarthritis, right knee: Secondary | ICD-10-CM | POA: Diagnosis not present

## 2024-07-28 ENCOUNTER — Encounter: Payer: Self-pay | Admitting: Physician Assistant

## 2024-08-19 DIAGNOSIS — I4891 Unspecified atrial fibrillation: Secondary | ICD-10-CM | POA: Diagnosis not present

## 2024-08-19 DIAGNOSIS — I10 Essential (primary) hypertension: Secondary | ICD-10-CM | POA: Diagnosis not present

## 2024-08-19 DIAGNOSIS — G4733 Obstructive sleep apnea (adult) (pediatric): Secondary | ICD-10-CM | POA: Diagnosis not present

## 2024-09-22 DIAGNOSIS — F411 Generalized anxiety disorder: Secondary | ICD-10-CM | POA: Diagnosis not present

## 2024-09-22 DIAGNOSIS — N401 Enlarged prostate with lower urinary tract symptoms: Secondary | ICD-10-CM | POA: Diagnosis not present

## 2024-09-22 DIAGNOSIS — E039 Hypothyroidism, unspecified: Secondary | ICD-10-CM | POA: Diagnosis not present

## 2024-09-22 DIAGNOSIS — I10 Essential (primary) hypertension: Secondary | ICD-10-CM | POA: Diagnosis not present

## 2024-09-22 DIAGNOSIS — I4891 Unspecified atrial fibrillation: Secondary | ICD-10-CM | POA: Diagnosis not present

## 2024-09-22 DIAGNOSIS — Z125 Encounter for screening for malignant neoplasm of prostate: Secondary | ICD-10-CM | POA: Diagnosis not present

## 2024-09-22 DIAGNOSIS — Z0000 Encounter for general adult medical examination without abnormal findings: Secondary | ICD-10-CM | POA: Diagnosis not present

## 2024-10-06 DIAGNOSIS — L814 Other melanin hyperpigmentation: Secondary | ICD-10-CM | POA: Diagnosis not present

## 2024-10-06 DIAGNOSIS — L821 Other seborrheic keratosis: Secondary | ICD-10-CM | POA: Diagnosis not present

## 2024-10-06 DIAGNOSIS — R233 Spontaneous ecchymoses: Secondary | ICD-10-CM | POA: Diagnosis not present

## 2024-10-06 DIAGNOSIS — D225 Melanocytic nevi of trunk: Secondary | ICD-10-CM | POA: Diagnosis not present

## 2024-10-11 NOTE — Progress Notes (Unsigned)
 Cardiology Office Note   Date:  10/12/2024  ID:  Jeffrey Farrell, DOB [birth year], MRN [id number] PCP: Cleotilde Mervin BRAVO, PA  [facility] Providers Cardiologist:  Maude Emmer, MD Electrophysiologist:  Eulas BRAVO Furbish, MD    History of Present Illness Jeffrey Farrell is a 68 y.o. male with a past medical history significant for atrial fibrillation who was last seen in the office in April by electrophysiology.  He had a single documented episode that was likely precipitated by excessive alcohol consumption of atrial fibrillation.  He presented to the ER.  Since then he quit drinking alcohol.  Monitor showed atrial runs.  Also has sleep apnea and struggles with his CPAP device.  CHA2DS2-VASc score is 3 and it was recommended he continue on Eliquis  for stroke prevention.  Finances are limited and so this has been an issue dating medication.  The patient was asking the cost of an ILR implant monitoring and if his insurance would cover this procedure.  Patient had canceled his 7/24 appointment with Dr. Furbish.  He has not decided whether or not he would like to proceed with ILR at that time.  He stated he is not confident that it is needed.  He also would like to stop Eliquis  due to cost.  I saw him 06/2024, he presents with atrial fibrillation for discussion regarding the use of a loop recorder and blood thinner management.  He has not experienced any episodes of atrial fibrillation since his hospitalization in December 2022, where his heart rate was 132 bpm. He questions the necessity of a loop recorder due to being asymptomatic and having stable blood pressure. Previously on Eliquis , he was taken off it for six to eight months and is concerned about the risks of stroke, which he understands to be 3.2%. He is considering using a KardiaMobile device for heart monitoring instead of a loop recorder due to concerns about cost and necessity.  I encouraged him to send me a MyChart message with  his decision once made.  For now, he remains on Eliquis .  He is not interested in a loop recorder due to cost and the monitoring fee associated with it.  Kardia mobile monitoring would be a much better option for him.  Reports no shortness of breath nor dyspnea on exertion. Reports no chest pain, pressure, or tightness. No orthopnea, PND. Reports no palpitations.   Discussed the use of AI scribe software for clinical note transcription with the patient, who gave verbal consent to proceed.  Today, he presents with a hx of atrial fibrillation (no documented episode since 2021) who presents for cardiovascular follow-up.  He experiences occasional skipped heartbeats, described as a pause in his pulse, confirmed using an oximeter. He is on a CPAP machine for sleep apnea, which has improved his sleep quality. He takes temazepam for sleep but plans to wean off it.  He takes Eliquis  5 mg twice daily for atrial fibrillation. His heart rate has been as low as 49 beats per minute at night but increases with activity. He takes a beta blocker, 12.5 mg twice daily, reduced from a higher dose due to erectile dysfunction concerns. No significant bleeding is reported.  He has not engaged in cardiovascular exercise for a year and a half due to diastasis recti, right knee arthritis, and an arthritic foot. He walks casually for a mile without raising his heart rate and is concerned about using a stationary bike due to his knee condition.  He abstained from  alcohol for the past two years after a period of heavy drinking that coincided with his initial atrial fibrillation diagnosis. He does not currently consume alcohol.  Reports no shortness of breath nor dyspnea on exertion. Reports no chest pain, pressure, or tightness. No edema, orthopnea, PND.   Discussed the use of AI scribe software for clinical note transcription with the patient, who gave verbal consent to proceed.  ROS: Pertinent ROS in HPI  Studies  Reviewed      11/11/23 Zio   Patient had a min HR of 43 bpm, max HR of 207 bpm, and avg HR of 70 bpm. Predominant underlying rhythm was Sinus Rhythm. First Degree AV Block was present. 1 run of Ventricular Tachycardia occurred lasting 4 beats with a max rate of 152 bpm (avg 126  bpm). 16 Supraventricular Tachycardia runs occurred, the run with the fastest interval lasting 4 beats with a max rate of 207 bpm, the longest lasting 5 beats with an avg rate of 98 bpm. Some episodes of Supraventricular Tachycardia may be possible  Atrial Tachycardia with variable block. Supraventricular Tachycardia was detected within +/- 45 seconds of symptomatic patient event(s). Isolated SVEs were rare (<1.0%), SVE Couplets were rare (<1.0%), and SVE Triplets were rare (<1.0%). Isolated VEs  were rare (<1.0%), VE Couplets were rare (<1.0%), and no VE Triplets were present.   11/14/23 Echo   IMPRESSIONS     1. Left ventricular ejection fraction, by estimation, is 55 to 60%. The  left ventricle has normal function. The left ventricle has no regional  wall motion abnormalities. Left ventricular diastolic parameters were  normal.   2. Right ventricular systolic function is normal. The right ventricular  size is normal. There is normal pulmonary artery systolic pressure. The  estimated right ventricular systolic pressure is 21.0 mmHg.   3. Left atrial size was mild to moderately dilated.   4. Right atrial size was mildly dilated.   5. The mitral valve is normal in structure. Trivial mitral valve  regurgitation. No evidence of mitral stenosis.   6. The aortic valve is tricuspid. There is moderate calcification of the  aortic valve. Aortic valve regurgitation is not visualized. Aortic valve  sclerosis/calcification is present, without any evidence of aortic  stenosis. Aortic valve mean gradient  measures 8.0 mmHg.   7. The inferior vena cava is normal in size with greater than 50%  respiratory variability,  suggesting right atrial pressure of 3 mmHg.   FINDINGS   Left Ventricle: Left ventricular ejection fraction, by estimation, is 55  to 60%. The left ventricle has normal function. The left ventricle has no  regional wall motion abnormalities. The left ventricular internal cavity  size was normal in size. There is   no left ventricular hypertrophy. Left ventricular diastolic parameters  were normal.   Right Ventricle: The right ventricular size is normal. No increase in  right ventricular wall thickness. Right ventricular systolic function is  normal. There is normal pulmonary artery systolic pressure. The tricuspid  regurgitant velocity is 2.12 m/s, and   with an assumed right atrial pressure of 3 mmHg, the estimated right  ventricular systolic pressure is 21.0 mmHg.   Left Atrium: Left atrial size was mild to moderately dilated.   Right Atrium: Right atrial size was mildly dilated.   Pericardium: There is no evidence of pericardial effusion.   Mitral Valve: The mitral valve is normal in structure. Trivial mitral  valve regurgitation. No evidence of mitral valve stenosis.   Tricuspid Valve:  The tricuspid valve is normal in structure. Tricuspid  valve regurgitation is trivial.   Aortic Valve: The aortic valve is tricuspid. There is moderate  calcification of the aortic valve. Aortic valve regurgitation is not  visualized. Aortic valve sclerosis/calcification is present, without any  evidence of aortic stenosis. Aortic valve mean  gradient measures 8.0 mmHg. Aortic valve peak gradient measures 12.3 mmHg.  Aortic valve area, by VTI measures 1.81 cm.   Pulmonic Valve: The pulmonic valve was normal in structure. Pulmonic valve  regurgitation is trivial.   Aorta: The aortic root is normal in size and structure.   Venous: The inferior vena cava is normal in size with greater than 50%  respiratory variability, suggesting right atrial pressure of 3 mmHg.   IAS/Shunts: No atrial  level shunt detected by color flow Doppler.   Risk Assessment/Calculations  CHA2DS2-VASc Score = 3   This indicates a 3.2% annual risk of stroke. The patient's score is based upon: CHF History: 0 HTN History: 1 Diabetes History: 0 Stroke History: 0 Vascular Disease History: 1 Age Score: 1 Gender Score: 0   Physical Exam VS:  BP (!) 116/52 (BP Location: Left Arm, Patient Position: Sitting)   Pulse 69   Ht 6' 1 (1.854 m)   Wt 207 lb 3.2 oz (94 kg)   SpO2 95%   BMI 27.34 kg/m        Wt Readings from Last 3 Encounters:  10/12/24 207 lb 3.2 oz (94 kg)  07/10/24 203 lb (92.1 kg)  03/06/24 206 lb (93.4 kg)    GEN: Well nourished, well developed in no acute distress NECK: No JVD; No carotid bruits CARDIAC: RRR, no murmurs, rubs, gallops RESPIRATORY:  Clear to auscultation without rales, wheezing or rhonchi  ABDOMEN: Soft, non-tender, non-distended EXTREMITIES:  No edema; No deformity   ASSESSMENT AND PLAN  Paroxysmal atrial fibrillation on anticoagulation Intermittent skipped beats, likely PACs or PVCs, not concerning unless frequent. No significant bleeding or fall risk. Eliquis  5 mg BID. Discussed loop recorder, but he prefers current management. - Continue Eliquis  5 mg BID. - Consider Kardia mobile device for daily monitoring. - Perform weekly ECG strips if symptomatic. - Follow up with EP  Right knee osteoarthritis Severe osteoarthritis with bone-on-bone contact. Considering knee replacement surgery in winter. Stationary bike and elliptical recommended for cardiovascular exercise. - Consider stationary bike or elliptical for exercise. - Plan knee replacement surgery in the near future -he does meet minimal mets for clearance  Benign prostatic hyperplasia with lower urinary tract symptoms and elevated PSA Elevated PSA noted. Appointment with urologist not yet scheduled. - Schedule appointment with urologist for elevated PSA evaluation.  Diastasis recti Prominent  diastasis recti. Advised to use upright bike to avoid exacerbation. - Use upright bike for exercise.  Obstructive sleep apnea on CPAP CPAP use at night has improved sleep quality. Adjusting to device after a year. - Continue CPAP use at night.  History of alcohol use disorder in sustained remission In sustained remission for two years. No alcohol consumption. Discussed alcohol's impact on arrhythmias. - Continue abstaining from alcohol.  Varicose veins with lower extremity swelling Intermittent leg swelling, possibly related to varicose veins. Previous procedures performed, veins regrow. - Monitor for changes in swelling or symptoms.  Hypertension -well controlled today -continue current medication regimen      Dispo: He can follow-up in 3 months Dr. Delford and 6 month with EP  Signed, Orren LOISE Fabry, PA-C

## 2024-10-12 ENCOUNTER — Encounter: Payer: Self-pay | Admitting: Physician Assistant

## 2024-10-12 ENCOUNTER — Ambulatory Visit: Attending: Physician Assistant | Admitting: Physician Assistant

## 2024-10-12 VITALS — BP 116/52 | HR 69 | Ht 73.0 in | Wt 207.2 lb

## 2024-10-12 DIAGNOSIS — D6869 Other thrombophilia: Secondary | ICD-10-CM | POA: Diagnosis not present

## 2024-10-12 DIAGNOSIS — I480 Paroxysmal atrial fibrillation: Secondary | ICD-10-CM

## 2024-10-12 DIAGNOSIS — I10 Essential (primary) hypertension: Secondary | ICD-10-CM

## 2024-10-12 DIAGNOSIS — I4811 Longstanding persistent atrial fibrillation: Secondary | ICD-10-CM

## 2024-10-12 NOTE — Patient Instructions (Signed)
 Medication Instructions:  Your physician recommends that you continue on your current medications as directed. Please refer to the Current Medication list given to you today. *If you need a refill on your cardiac medications before your next appointment, please call your pharmacy*  Lab Work: None Ordered If you have labs (blood work) drawn today and your tests are completely normal, you will receive your results only by: MyChart Message (if you have MyChart) OR A paper copy in the mail If you have any lab test that is abnormal or we need to change your treatment, we will call you to review the results.  Testing/Procedures: None ordered  Follow-Up: At [facility], you and your health needs are our priority.  As part of our continuing mission to provide you with exceptional heart care, our providers are all part of one team.  This team includes your primary Cardiologist (physician) and Advanced Practice Providers or APPs (Physician Assistants and Nurse Practitioners) who all work together to provide you with the care you need, when you need it.  Your next appointment:   3 month(s)  Provider:   Maude Emmer, MD   6 months with Dr Nancey   We recommend signing up for the patient portal called MyChart.  Sign up information is provided on this After Visit Summary.  MyChart is used to connect with patients for Virtual Visits (Telemedicine).  Patients are able to view lab/test results, encounter notes, upcoming appointments, etc.  Non-urgent messages can be sent to your provider as well.   To learn more about what you can do with MyChart, go to [email or web address].   Other Instructions

## 2024-12-01 ENCOUNTER — Other Ambulatory Visit: Payer: Self-pay | Admitting: Urology

## 2024-12-01 ENCOUNTER — Encounter: Payer: Self-pay | Admitting: Urology

## 2024-12-01 DIAGNOSIS — R972 Elevated prostate specific antigen [PSA]: Secondary | ICD-10-CM

## 2024-12-13 ENCOUNTER — Emergency Department (HOSPITAL_COMMUNITY)

## 2024-12-13 ENCOUNTER — Emergency Department (HOSPITAL_COMMUNITY)
Admission: EM | Admit: 2024-12-13 | Discharge: 2024-12-13 | Disposition: A | Discharge status: Home / Self Care | Attending: Emergency Medicine | Admitting: Emergency Medicine

## 2024-12-13 ENCOUNTER — Encounter (HOSPITAL_COMMUNITY): Payer: Self-pay | Admitting: Emergency Medicine

## 2024-12-13 DIAGNOSIS — Z7901 Long term (current) use of anticoagulants: Secondary | ICD-10-CM | POA: Diagnosis not present

## 2024-12-13 DIAGNOSIS — H538 Other visual disturbances: Secondary | ICD-10-CM | POA: Diagnosis present

## 2024-12-13 DIAGNOSIS — I4891 Unspecified atrial fibrillation: Secondary | ICD-10-CM | POA: Insufficient documentation

## 2024-12-13 DIAGNOSIS — H5462 Unqualified visual loss, left eye, normal vision right eye: Secondary | ICD-10-CM | POA: Diagnosis not present

## 2024-12-13 DIAGNOSIS — Z79899 Other long term (current) drug therapy: Secondary | ICD-10-CM | POA: Insufficient documentation

## 2024-12-13 LAB — I-STAT CHEM 8, ED
BUN: 11 mg/dL (ref 8–23)
Calcium, Ion: 1.21 mmol/L (ref 1.15–1.40)
Chloride: 100 mmol/L (ref 98–111)
Creatinine, Ser: 1 mg/dL (ref 0.61–1.24)
Glucose, Bld: 98 mg/dL (ref 70–99)
HCT: 43 % (ref 39.0–52.0)
Hemoglobin: 14.6 g/dL (ref 13.0–17.0)
Potassium: 4.4 mmol/L (ref 3.5–5.1)
Sodium: 137 mmol/L (ref 135–145)
TCO2: 21 mmol/L — ABNORMAL LOW (ref 22–32)

## 2024-12-13 LAB — CBC
HCT: 43.3 % (ref 39.0–52.0)
Hemoglobin: 14.3 g/dL (ref 13.0–17.0)
MCH: 30.8 pg (ref 26.0–34.0)
MCHC: 33 g/dL (ref 30.0–36.0)
MCV: 93.3 fL (ref 80.0–100.0)
Platelets: 260 K/uL (ref 150–400)
RBC: 4.64 MIL/uL (ref 4.22–5.81)
RDW: 12.4 % (ref 11.5–15.5)
WBC: 5.8 K/uL (ref 4.0–10.5)
nRBC: 0 % (ref 0.0–0.2)

## 2024-12-13 LAB — COMPREHENSIVE METABOLIC PANEL WITH GFR
ALT: 11 U/L (ref 0–44)
AST: 23 U/L (ref 15–41)
Albumin: 4.3 g/dL (ref 3.5–5.0)
Alkaline Phosphatase: 56 U/L (ref 38–126)
Anion gap: 13 (ref 5–15)
BUN: 11 mg/dL (ref 8–23)
CO2: 22 mmol/L (ref 22–32)
Calcium: 9.4 mg/dL (ref 8.9–10.3)
Chloride: 101 mmol/L (ref 98–111)
Creatinine, Ser: 0.89 mg/dL (ref 0.61–1.24)
GFR, Estimated: >60 mL/min
Glucose, Bld: 90 mg/dL (ref 70–99)
Potassium: 4.6 mmol/L (ref 3.5–5.1)
Sodium: 135 mmol/L (ref 135–145)
Total Bilirubin: 0.5 mg/dL (ref 0.0–1.2)
Total Protein: 7.2 g/dL (ref 6.5–8.1)

## 2024-12-13 LAB — DIFFERENTIAL
Abs Immature Granulocytes: 0.03 K/uL (ref 0.00–0.07)
Basophils Absolute: 0.1 K/uL (ref 0.0–0.1)
Basophils Relative: 1 %
Eosinophils Absolute: 0.1 K/uL (ref 0.0–0.5)
Eosinophils Relative: 2 %
Immature Granulocytes: 1 %
Lymphocytes Relative: 18 %
Lymphs Abs: 1.1 K/uL (ref 0.7–4.0)
Monocytes Absolute: 0.4 K/uL (ref 0.1–1.0)
Monocytes Relative: 7 %
Neutro Abs: 4.1 K/uL (ref 1.7–7.7)
Neutrophils Relative %: 71 %

## 2024-12-13 LAB — CBG MONITORING, ED: Glucose-Capillary: 84 mg/dL (ref 70–99)

## 2024-12-13 LAB — PROTIME-INR
INR: 1.1 (ref 0.8–1.2)
Prothrombin Time: 14.4 s (ref 11.4–15.2)

## 2024-12-13 LAB — ETHANOL: Alcohol, Ethyl (B): <15 mg/dL

## 2024-12-13 LAB — APTT: aPTT: 36 s (ref 24–36)

## 2024-12-13 MED ORDER — IOHEXOL 350 MG/ML SOLN
75.0000 mL | Freq: Once | INTRAVENOUS | Status: AC | PRN
  Administered 2024-12-13: 75 mL via INTRAVENOUS

## 2024-12-13 NOTE — ED Provider Notes (Signed)
 " [facility] Provider Note   CSN: [id number] Arrival date & time: 12/13/24  1136     Patient presents with: Visual Field Change   Jeffrey Farrell is a 69 y.o. male.   HPI     Patient comes in with chief complaint left-sided vision changes.  Patient has history of A-fib.  He reports that this morning when he woke up, he noted shadow of the left eye laterally.  There was a big triangular area that he just appeared dark.  Subsequently his vision has improved, but he still feels that his peripheral vision is not proper over his left eye.  Patient has no headache, eye pain.  Last known normal at 10 PM when he went to bed.  Patient denies any associated dizziness, balance issues, one-sided weakness, numbness, slurred speech.  Prior to Admission medications  Medication Sig Start Date End Date Taking? Authorizing Provider  albuterol (VENTOLIN HFA) 108 (90 Base) MCG/ACT inhaler 1 puff as needed for wheezing or shortness of breath. 03/14/23   [provider]  apixaban  (ELIQUIS ) 5 MG TABS tablet Take 1 tablet (5 mg total) by mouth 2 (two) times daily. 06/01/24   Nishan, Peter C, MD  augmented betamethasone dipropionate (DIPROLENE-AF) 0.05 % cream Apply topically as needed. 01/01/24   [provider]  clotrimazole-betamethasone (LOTRISONE) cream 1 Application as needed (eczema). 02/08/22   [provider]  folic acid  (FOLVITE ) 1 MG tablet Take 1 tablet (1 mg total) by mouth daily. 11/10/21   Cresenzo, John V, MD  levothyroxine  (SYNTHROID ) 150 MCG tablet Take 1 tablet (150 mcg total) by mouth daily. 11/10/21   Cresenzo, John V, MD  metoprolol  tartrate (LOPRESSOR ) 25 MG tablet Take 0.5 tablets (12.5 mg total) by mouth 2 (two) times daily. 03/27/24   Williams, Evan, PA-C  pantoprazole  (PROTONIX ) 40 MG tablet Take 1 tablet (40 mg total) by mouth daily. Patient taking differently: Take 40 mg by mouth daily. As needed 12/29/21   Armbruster,  Elspeth SQUIBB, MD  tadalafil (CIALIS) 5 MG tablet Take 5 mg by mouth daily as needed for erectile dysfunction.    [provider]  tamsulosin  (FLOMAX ) 0.4 MG CAPS capsule Take 1 capsule (0.4 mg total) by mouth daily. 11/10/21   Cresenzo, John V, MD  temazepam (RESTORIL) 30 MG capsule Take 30 mg by mouth at bedtime. 10/31/21   [provider]  thiamine  100 MG tablet Take 1 tablet (100 mg total) by mouth daily. 11/10/21   Cresenzo, John V, MD    Allergies: Patient has no known allergies.    Review of Systems  All other systems reviewed and are negative.   Updated Vital Signs BP (!) 143/76   Pulse 62   Temp 98.4 F (36.9 C) (Oral)   Resp 16   SpO2 100%   Physical Exam Vitals and nursing note reviewed.  Constitutional:      Appearance: He is well-developed.  HENT:     Head: Atraumatic.  Eyes:     Extraocular Movements: Extraocular movements intact.     Pupils: Pupils are equal, round, and reactive to light.     Comments: Able to finger count, but peripheral vision of the left eye does appear to be impaired laterally  Cardiovascular:     Rate and Rhythm: Normal rate.  Pulmonary:     Effort: Pulmonary effort is normal.  Musculoskeletal:     Cervical back: Normal range of motion and neck supple. No rigidity.  Lymphadenopathy:     Cervical: No cervical adenopathy.  Skin:    General: Skin is warm.  Neurological:     Mental Status: He is alert and oriented to person, place, and time.     Cranial Nerves: No cranial nerve deficit.     Sensory: No sensory deficit.     Motor: No weakness.     Coordination: Coordination normal.     Comments: NIH stroke scale is 0     (all labs ordered are listed, but only abnormal results are displayed) Labs Reviewed  I-STAT CHEM 8, ED - Abnormal; Notable for the following components:      Result Value   TCO2 21 (*)    All other components within normal limits  PROTIME-INR  APTT  CBC  DIFFERENTIAL  COMPREHENSIVE METABOLIC  PANEL WITH GFR  ETHANOL  CBG MONITORING, ED    EKG: None  Radiology: No results found.    Ultrasound ED Ocular  Date/Time: 12/13/2024 11:53 AM  Performed by: Charlyn Sora, MD Authorized by: Charlyn Sora, MD   PROCEDURE DETAILS:    Indications: visual change     Assessed:  Left eye   Left eye axial view: obtained     Left eye saggital view: obtained     Images: archived   LEFT EYE FINDINGS:     no foreign body noted in left eye    left eye lens not dislodged    no retrobulbar hematoma in left eye    no evidence of retinal detachment of the left eye    no ruptured globe in left eye    no vitreous hemorrhage in left eye    Medications Ordered in the ED  iohexol  (OMNIPAQUE ) 350 MG/ML injection 75 mL (75 mLs Intravenous Contrast Given 12/13/24 1341)    Clinical Course as of 12/23/24 0802  Sun Dec 13, 2024  1718 Assumed care from Dr Charlyn. 69 yo M hx of AF on eliquis  who presented with L visual field shadow at 0430 in the morning. Monocular L lateral field cut. Bedside US  without retinal detachment. Getting stroke workup. If negative will need ophtho fu tm.  [RP]  1746 MRI without evidence of stroke. CTA with no significant stenosis.  Consulted Dr Austin from ophalmology regarding dilated eye exam.  Requested the patient come to the office at 8 AM tomorrow morning.  Patient informed of this and instructed to call them if he misses that appointment for some reason for [RP]    Clinical Course User Index [RP] Jeffrey Lamar BROCKS, MD                                 Medical Decision Making Amount and/or Complexity of Data Reviewed Radiology: ordered.   69 year old male with history of A-fib comes in with chief complaint of left-sided vision loss.  Differential diagnosis for this patient includes CRAO, CRVO, stroke, TIA.  Bedside ultrasound was overall reassuring.  As such, CT angiogram and MRI of the brain has been ordered.  If the workup is negative, then we will  consult ophthalmology.  Final diagnoses:  Vision loss, left eye    ED Discharge Orders     None          Charlyn Sora, MD 12/23/24 [phone]  "

## 2024-12-13 NOTE — ED Notes (Signed)
 Patient discharged by RN. Patient verbalizes understanding of instructions. Ambulatory to lobby at time of discharge without assistance. GCS 15.

## 2024-12-13 NOTE — Discharge Instructions (Signed)
 Your workup did not show a stroke  Follow-up with ophthalmology tomorrow at 8 am in the location listed. If you miss this appointment please call them and try to get an appointment as soon as possible.

## 2024-12-13 NOTE — ED Provider Notes (Signed)
" °  Physical Exam  BP (!) 143/76   Pulse 62   Temp 98.4 F (36.9 C) (Oral)   Resp 16   SpO2 100%   Physical Exam  Procedures  Procedures  ED Course / MDM   Clinical Course as of 12/13/24 1941  Austin Dec 13, 2024  1718 Assumed care from Dr Charlyn. 69 yo M hx of AF on eliquis  who presented with L visual field shadow at 0430 in the morning. Monocular L lateral field cut. Bedside US  without retinal detachment. Getting stroke workup. If negative will need ophtho fu tm.  [RP]  1746 MRI without evidence of stroke. CTA with no significant stenosis.  Consulted Dr Austin from ophalmology regarding dilated eye exam.  Requested the patient come to the office at 8 AM tomorrow morning.  Patient informed of this and instructed to call them if he misses that appointment for some reason for [RP]    Clinical Course User Index [RP] [name], MD   Medical Decision Making Amount and/or Complexity of Data Reviewed Radiology: ordered.      [name], MD 12/13/24 1941  "

## 2024-12-13 NOTE — ED Triage Notes (Signed)
 Pt arrives via POV. Pt reports waking up this morning around 0500 with vision changes in his left eye. States he has a shadow in his peripheral field. Denies any other symptoms. Pt is AxOx4. Does take eliquis .

## 2024-12-13 NOTE — ED Provider Triage Note (Signed)
 Emergency Medicine Provider Triage Evaluation Note  Jeffrey Farrell , a 69 y.o. male  was evaluated in triage.  Pt complains of visual field deficit of the lateral left eye. Woke up this morning with decreased vision in a triangular pattern on lateral left eye. States that over the last few hours it has mildly improved however he has a weird sensation over his lateral eye. No hx of stroke or vision problems other than occasional floaters. Denies acute injury. Denies seeing lots of floaters today or flashes of light. On eliquis  for history of AFIB.   Review of Systems  Positive: Vision change Negative: Nausea, vomiting, trauma/injury  Physical Exam  BP 135/74 (BP Location: Right Arm)   Pulse 64   Temp 98.9 F (37.2 C) (Oral)   Resp 18   SpO2 100%  Gen:   Awake, no distress   Resp:  Normal effort  MSK:   Moves extremities without difficulty  Other:  Vision grossly intact with no evidence of visual field deficit, no facial droop, slurred speech, or extremity weakness, coordination intact  Medical Decision Making  Medically screening exam initiated at 12:01 PM.  Appropriate orders placed.  Jeffrey Farrell was informed that the remainder of the evaluation will be completed by another provider, this initial triage assessment does not replace that evaluation, and the importance of remaining in the ED until their evaluation is complete.  Attending Dr. Charlyn evaluated in triage, ocular ultrasound does not show retinal detachment, will start stroke work-up, patient does not meet code stroke criteria as last known well was last night, do not suspect LVO based on exam, patient also on eliquis     Huldah Marin F, PA-C 12/13/24 1232

## 2024-12-13 NOTE — ED Triage Notes (Signed)
 PT complains of shadow on side of left eye. PT states he could tell something was not right even before fully opening eyes when he woke around 445. Denies pain but states there is a sensation on the left side of the left eye. Wears cpap at night and thought that maybe the mask was on too tight but wanted to make sure there is nothing wrong with retina. Denies blurred vision. Denies any other complains on left side of body. No headache, no dizziness no chest pain. Pt is on blood thinners and hx of afib.

## 2024-12-13 NOTE — ED Notes (Signed)
 Patient transported to MRI

## 2024-12-22 ENCOUNTER — Other Ambulatory Visit (HOSPITAL_COMMUNITY): Payer: Self-pay | Admitting: Urology

## 2024-12-22 DIAGNOSIS — R972 Elevated prostate specific antigen [PSA]: Secondary | ICD-10-CM

## 2025-01-11 ENCOUNTER — Ambulatory Visit (HOSPITAL_COMMUNITY)

## 2025-01-22 ENCOUNTER — Ambulatory Visit: Payer: Self-pay | Admitting: Cardiovascular Disease
